# Patient Record
Sex: Male | Born: 1935 | Race: White | Hispanic: No | Marital: Married | Smoking: Never smoker
Health system: Southern US, Community
[De-identification: ages and names within clinical notes are randomized; demographics above are authoritative.]

## PROBLEM LIST (undated history)

## (undated) DIAGNOSIS — J449 Chronic obstructive pulmonary disease, unspecified: Secondary | ICD-10-CM

## (undated) DIAGNOSIS — D126 Benign neoplasm of colon, unspecified: Secondary | ICD-10-CM

## (undated) DIAGNOSIS — Z87438 Personal history of other diseases of male genital organs: Secondary | ICD-10-CM

## (undated) DIAGNOSIS — Z8619 Personal history of other infectious and parasitic diseases: Secondary | ICD-10-CM

## (undated) DIAGNOSIS — K219 Gastro-esophageal reflux disease without esophagitis: Secondary | ICD-10-CM

## (undated) DIAGNOSIS — Z862 Personal history of diseases of the blood and blood-forming organs and certain disorders involving the immune mechanism: Secondary | ICD-10-CM

## (undated) DIAGNOSIS — Z85828 Personal history of other malignant neoplasm of skin: Secondary | ICD-10-CM

## (undated) DIAGNOSIS — I341 Nonrheumatic mitral (valve) prolapse: Secondary | ICD-10-CM

## (undated) HISTORY — PX: TONSILLECTOMY: SHX5217

## (undated) HISTORY — DX: Personal history of diseases of the blood and blood-forming organs and certain disorders involving the immune mechanism: Z86.2

## (undated) HISTORY — DX: Nonrheumatic mitral (valve) prolapse: I34.1

## (undated) HISTORY — DX: Gastro-esophageal reflux disease without esophagitis: K21.9

## (undated) HISTORY — PX: APPENDECTOMY: SHX54

## (undated) HISTORY — DX: Personal history of other diseases of male genital organs: Z87.438

## (undated) HISTORY — PX: HERNIA REPAIR: SHX51

## (undated) HISTORY — DX: Chronic obstructive pulmonary disease, unspecified: J44.9

## (undated) HISTORY — DX: Personal history of other infectious and parasitic diseases: Z86.19

## (undated) HISTORY — DX: Personal history of other malignant neoplasm of skin: Z85.828

## (undated) HISTORY — DX: Benign neoplasm of colon, unspecified: D12.6

---

## 2000-04-20 ENCOUNTER — Ambulatory Visit (HOSPITAL_BASED_OUTPATIENT_CLINIC_OR_DEPARTMENT_OTHER): Admission: RE | Admit: 2000-04-20 | Discharge: 2000-04-20 | Payer: Self-pay | Admitting: *Deleted

## 2001-06-28 ENCOUNTER — Encounter: Payer: Self-pay | Admitting: Family Medicine

## 2001-06-28 ENCOUNTER — Encounter: Admission: RE | Admit: 2001-06-28 | Discharge: 2001-06-28 | Payer: Self-pay | Admitting: Family Medicine

## 2001-10-23 ENCOUNTER — Ambulatory Visit (HOSPITAL_COMMUNITY): Admission: RE | Admit: 2001-10-23 | Discharge: 2001-10-23 | Payer: Self-pay | Admitting: Gastroenterology

## 2003-07-12 ENCOUNTER — Encounter: Admission: RE | Admit: 2003-07-12 | Discharge: 2003-07-12 | Payer: Self-pay | Admitting: Family Medicine

## 2003-07-12 ENCOUNTER — Encounter: Payer: Self-pay | Admitting: Family Medicine

## 2004-01-30 ENCOUNTER — Ambulatory Visit (HOSPITAL_BASED_OUTPATIENT_CLINIC_OR_DEPARTMENT_OTHER): Admission: RE | Admit: 2004-01-30 | Discharge: 2004-01-30 | Payer: Self-pay | Admitting: Surgery

## 2004-01-30 ENCOUNTER — Ambulatory Visit (HOSPITAL_COMMUNITY): Admission: RE | Admit: 2004-01-30 | Discharge: 2004-01-30 | Payer: Self-pay | Admitting: Surgery

## 2004-08-02 ENCOUNTER — Emergency Department (HOSPITAL_COMMUNITY): Admission: EM | Admit: 2004-08-02 | Discharge: 2004-08-03 | Payer: Self-pay | Admitting: Emergency Medicine

## 2004-08-04 ENCOUNTER — Emergency Department (HOSPITAL_COMMUNITY): Admission: EM | Admit: 2004-08-04 | Discharge: 2004-08-04 | Payer: Self-pay | Admitting: Emergency Medicine

## 2004-08-07 ENCOUNTER — Emergency Department (HOSPITAL_COMMUNITY): Admission: EM | Admit: 2004-08-07 | Discharge: 2004-08-07 | Payer: Self-pay | Admitting: Emergency Medicine

## 2004-08-20 ENCOUNTER — Encounter: Admission: RE | Admit: 2004-08-20 | Discharge: 2004-08-20 | Payer: Self-pay | Admitting: Family Medicine

## 2004-08-27 ENCOUNTER — Encounter: Admission: RE | Admit: 2004-08-27 | Discharge: 2004-08-27 | Payer: Self-pay | Admitting: Gastroenterology

## 2004-09-07 ENCOUNTER — Ambulatory Visit (HOSPITAL_COMMUNITY): Admission: RE | Admit: 2004-09-07 | Discharge: 2004-09-07 | Payer: Self-pay | Admitting: Gastroenterology

## 2005-06-22 ENCOUNTER — Encounter: Admission: RE | Admit: 2005-06-22 | Discharge: 2005-06-22 | Payer: Self-pay | Admitting: Gastroenterology

## 2005-06-29 ENCOUNTER — Ambulatory Visit (HOSPITAL_COMMUNITY): Admission: RE | Admit: 2005-06-29 | Discharge: 2005-06-29 | Payer: Self-pay | Admitting: Gastroenterology

## 2006-03-23 ENCOUNTER — Encounter: Admission: RE | Admit: 2006-03-23 | Discharge: 2006-03-23 | Payer: Self-pay | Admitting: Family Medicine

## 2011-10-26 ENCOUNTER — Other Ambulatory Visit: Payer: Self-pay | Admitting: Gastroenterology

## 2012-03-21 ENCOUNTER — Other Ambulatory Visit: Payer: Self-pay | Admitting: Dermatology

## 2012-04-20 ENCOUNTER — Other Ambulatory Visit: Payer: Self-pay | Admitting: Dermatology

## 2012-07-04 ENCOUNTER — Other Ambulatory Visit (HOSPITAL_COMMUNITY): Payer: Self-pay | Admitting: Radiology

## 2012-07-04 DIAGNOSIS — R0602 Shortness of breath: Secondary | ICD-10-CM

## 2012-07-05 ENCOUNTER — Ambulatory Visit (HOSPITAL_COMMUNITY): Payer: Medicare Other | Attending: Cardiovascular Disease | Admitting: Radiology

## 2012-07-05 DIAGNOSIS — R0989 Other specified symptoms and signs involving the circulatory and respiratory systems: Secondary | ICD-10-CM | POA: Insufficient documentation

## 2012-07-05 DIAGNOSIS — I059 Rheumatic mitral valve disease, unspecified: Secondary | ICD-10-CM | POA: Insufficient documentation

## 2012-07-05 DIAGNOSIS — R079 Chest pain, unspecified: Secondary | ICD-10-CM | POA: Insufficient documentation

## 2012-07-05 DIAGNOSIS — R0609 Other forms of dyspnea: Secondary | ICD-10-CM | POA: Insufficient documentation

## 2012-07-05 DIAGNOSIS — R0602 Shortness of breath: Secondary | ICD-10-CM | POA: Insufficient documentation

## 2012-07-05 NOTE — Progress Notes (Signed)
Echocardiogram performed.  

## 2012-07-06 ENCOUNTER — Encounter (HOSPITAL_COMMUNITY): Payer: Self-pay | Admitting: Family Medicine

## 2012-08-30 ENCOUNTER — Institutional Professional Consult (permissible substitution): Payer: Medicare Other | Admitting: Internal Medicine

## 2012-11-01 ENCOUNTER — Other Ambulatory Visit: Payer: Self-pay | Admitting: Dermatology

## 2013-07-05 ENCOUNTER — Other Ambulatory Visit: Payer: Self-pay | Admitting: Dermatology

## 2013-09-03 ENCOUNTER — Other Ambulatory Visit: Payer: Self-pay | Admitting: Dermatology

## 2013-09-05 ENCOUNTER — Ambulatory Visit (INDEPENDENT_AMBULATORY_CARE_PROVIDER_SITE_OTHER): Payer: Medicare PPO | Admitting: Family Medicine

## 2013-09-05 DIAGNOSIS — Z23 Encounter for immunization: Secondary | ICD-10-CM

## 2013-11-12 ENCOUNTER — Ambulatory Visit (INDEPENDENT_AMBULATORY_CARE_PROVIDER_SITE_OTHER): Payer: Medicare PPO | Admitting: Family Medicine

## 2013-11-12 DIAGNOSIS — Z23 Encounter for immunization: Secondary | ICD-10-CM

## 2013-12-03 ENCOUNTER — Other Ambulatory Visit: Payer: Self-pay | Admitting: Family Medicine

## 2013-12-03 ENCOUNTER — Telehealth: Payer: Self-pay | Admitting: Family Medicine

## 2013-12-03 MED ORDER — AMOXICILLIN 875 MG PO TABS: 875.0000 mg | ORAL_TABLET | Freq: Two times a day (BID) | ORAL | Status: DC

## 2013-12-03 NOTE — Telephone Encounter (Signed)
Sounds like upper resp infection.  Most likely viral.  I am okay with calling out amoxicillin 875 bid for 10 days (there is no pharmacy on record) with strict instructions not to fill unless he develops sinus pain or fever or symptoms last more than 1 week.

## 2013-12-03 NOTE — Telephone Encounter (Signed)
Patient aware and med sent to pharm 

## 2013-12-03 NOTE — Telephone Encounter (Signed)
He says he has a sinus infection and wants something called in.  No fever , coughing up some yellowish stuff Please call

## 2014-06-04 ENCOUNTER — Other Ambulatory Visit: Payer: Medicare PPO

## 2014-06-04 ENCOUNTER — Encounter: Payer: Self-pay | Admitting: Family Medicine

## 2014-06-04 DIAGNOSIS — Z79899 Other long term (current) drug therapy: Secondary | ICD-10-CM

## 2014-06-04 DIAGNOSIS — Z862 Personal history of diseases of the blood and blood-forming organs and certain disorders involving the immune mechanism: Secondary | ICD-10-CM | POA: Insufficient documentation

## 2014-06-04 DIAGNOSIS — Z87438 Personal history of other diseases of male genital organs: Secondary | ICD-10-CM | POA: Insufficient documentation

## 2014-06-04 DIAGNOSIS — K219 Gastro-esophageal reflux disease without esophagitis: Secondary | ICD-10-CM | POA: Insufficient documentation

## 2014-06-04 DIAGNOSIS — N400 Enlarged prostate without lower urinary tract symptoms: Secondary | ICD-10-CM

## 2014-06-04 DIAGNOSIS — Z0000 Encounter for general adult medical examination without abnormal findings: Secondary | ICD-10-CM

## 2014-06-04 DIAGNOSIS — Z8619 Personal history of other infectious and parasitic diseases: Secondary | ICD-10-CM | POA: Insufficient documentation

## 2014-06-04 DIAGNOSIS — D126 Benign neoplasm of colon, unspecified: Secondary | ICD-10-CM | POA: Insufficient documentation

## 2014-06-04 DIAGNOSIS — J449 Chronic obstructive pulmonary disease, unspecified: Secondary | ICD-10-CM | POA: Insufficient documentation

## 2014-06-04 DIAGNOSIS — I341 Nonrheumatic mitral (valve) prolapse: Secondary | ICD-10-CM | POA: Insufficient documentation

## 2014-06-04 DIAGNOSIS — D869 Sarcoidosis, unspecified: Secondary | ICD-10-CM

## 2014-06-04 DIAGNOSIS — Z85828 Personal history of other malignant neoplasm of skin: Secondary | ICD-10-CM | POA: Insufficient documentation

## 2014-06-04 LAB — COMPLETE METABOLIC PANEL WITH GFR
ALT: 14 U/L (ref 0–53)
AST: 20 U/L (ref 0–37)
Albumin: 4.3 g/dL (ref 3.5–5.2)
Alkaline Phosphatase: 56 U/L (ref 39–117)
BUN: 17 mg/dL (ref 6–23)
CALCIUM: 9.4 mg/dL (ref 8.4–10.5)
CO2: 30 mEq/L (ref 19–32)
CREATININE: 0.85 mg/dL (ref 0.50–1.35)
Chloride: 103 mEq/L (ref 96–112)
GFR, EST NON AFRICAN AMERICAN: 83 mL/min
GFR, Est African American: >89 mL/min
GLUCOSE: 94 mg/dL (ref 70–99)
POTASSIUM: 4.8 meq/L (ref 3.5–5.3)
Sodium: 139 mEq/L (ref 135–145)
TOTAL PROTEIN: 6.6 g/dL (ref 6.0–8.3)
Total Bilirubin: 1 mg/dL (ref 0.2–1.2)

## 2014-06-04 LAB — LIPID PANEL
CHOL/HDL RATIO: 2.6 ratio
CHOLESTEROL: 161 mg/dL (ref 0–200)
HDL: 63 mg/dL (ref >39)
LDL Cholesterol: 87 mg/dL (ref 0–99)
TRIGLYCERIDES: 57 mg/dL (ref <150)
VLDL: 11 mg/dL (ref 0–40)

## 2014-06-04 LAB — CBC WITH DIFFERENTIAL/PLATELET
BASOS ABS: 0 10*3/uL (ref 0.0–0.1)
BASOS PCT: 0 % (ref 0–1)
Eosinophils Absolute: 0.2 10*3/uL (ref 0.0–0.7)
Eosinophils Relative: 4 % (ref 0–5)
HEMATOCRIT: 43.3 % (ref 39.0–52.0)
Hemoglobin: 14.7 g/dL (ref 13.0–17.0)
LYMPHS PCT: 26 % (ref 12–46)
Lymphs Abs: 1.3 10*3/uL (ref 0.7–4.0)
MCH: 31.3 pg (ref 26.0–34.0)
MCHC: 33.9 g/dL (ref 30.0–36.0)
MCV: 92.3 fL (ref 78.0–100.0)
MONOS PCT: 13 % — AB (ref 3–12)
Monocytes Absolute: 0.7 10*3/uL (ref 0.1–1.0)
NEUTROS PCT: 57 % (ref 43–77)
Neutro Abs: 2.9 10*3/uL (ref 1.7–7.7)
Platelets: 156 10*3/uL (ref 150–400)
RBC: 4.69 MIL/uL (ref 4.22–5.81)
RDW: 14.3 % (ref 11.5–15.5)
WBC: 5 10*3/uL (ref 4.0–10.5)

## 2014-06-04 LAB — TSH: TSH: 1.931 u[IU]/mL (ref 0.350–4.500)

## 2014-06-05 LAB — PSA, MEDICARE: PSA: 2.39 ng/mL (ref <=4.00)

## 2014-06-10 ENCOUNTER — Ambulatory Visit (INDEPENDENT_AMBULATORY_CARE_PROVIDER_SITE_OTHER): Payer: Medicare PPO | Admitting: Family Medicine

## 2014-06-10 ENCOUNTER — Encounter: Payer: Self-pay | Admitting: Family Medicine

## 2014-06-10 VITALS — BP 100/60 | HR 86 | Temp 97.9°F | Resp 16 | Ht 76.0 in | Wt 149.0 lb

## 2014-06-10 DIAGNOSIS — M79609 Pain in unspecified limb: Secondary | ICD-10-CM

## 2014-06-10 DIAGNOSIS — Z0000 Encounter for general adult medical examination without abnormal findings: Secondary | ICD-10-CM

## 2014-06-10 DIAGNOSIS — M79642 Pain in left hand: Secondary | ICD-10-CM

## 2014-06-10 NOTE — Progress Notes (Signed)
Subjective:    Patient ID: Bradley Beasley, male    DOB: 13-Sep-1933, 80 y.o.   MRN: [id number]  HPI Subjective:   Patient presents for Medicare Annual/Subsequent preventive examination. Patient injured his left hand in February leading plan. He continues to have pain along the body of the fifth metacarpal in the left hand. He also complains of some numbness and tingling in the toes on both feet.  Review Past Medical/Family/Social: Past Medical History  Diagnosis Date  . H/O sarcoidosis   . History of BPH   . History of Helicobacter pylori infection   . GERD (gastroesophageal reflux disease)     w/ hiatal hernia  . COPD (chronic obstructive pulmonary disease)   . MVP (mitral valve prolapse)     MV regerg  . Tubular adenoma of colon   . Personal history of other malignant neoplasm of skin    Past Surgical History  Procedure Laterality Date  . Tonsillectomy    . Hernia repair    . Appendectomy     No current outpatient prescriptions on file prior to visit.   No current facility-administered medications on file prior to visit.   Allergies  Allergen Reactions  . Celebrex [Celecoxib] Nausea And Vomiting  . Codeine   . Prevacid [Lansoprazole]    History   Social History  . Marital Status: Married    Spouse Name: N/A    Number of Children: N/A  . Years of Education: N/A   Occupational History  . Not on file.   Social History Main Topics  . Smoking status: Never Smoker   . Smokeless tobacco: Never Used  . Alcohol Use: No  . Drug Use: No  . Sexual Activity: Not Currently   Other Topics Concern  . Not on file   Social History Narrative  . No narrative on file   Family History  Problem Relation Age of Onset  . Cancer Mother     ovarian  . Cancer Father     pancreatic     Risk Factors  Current exercise habits:  Dietary issues discussed:   Cardiac risk factors: Obesity (BMI >= 30 kg/m2).   Depression Screen  (Note: if answer to either of the following  is "Yes", a more complete depression screening is indicated)  Over the past two weeks, have you felt down, depressed or hopeless? No Over the past two weeks, have you felt little interest or pleasure in doing things? No Have you lost interest or pleasure in daily life? No Do you often feel hopeless? No Do you cry easily over simple problems? No   Activities of Daily Living  In your present state of health, do you have any difficulty performing the following activities?:  Driving? No  Managing money? No  Feeding yourself? No  Getting from bed to chair? No  Climbing a flight of stairs? No  Preparing food and eating?: No  Bathing or showering? No  Getting dressed: No  Getting to the toilet? No  Using the toilet:No  Moving around from place to place: No  In the past year have you fallen or had a near fall?:No  Are you sexually active? No  Do you have more than one partner? No   Hearing Difficulties: No  Do you often ask people to speak up or repeat themselves? No  Do you experience ringing or noises in your ears? No Do you have difficulty understanding soft or whispered voices? No  Do you feel that you  have a problem with memory? No Do you often misplace items? No  Do you feel safe at home? Yes  Cognitive Testing  Alert? Yes Normal Appearance?Yes  Oriented to person? Yes Place? Yes  Time? Yes  Recall of three objects? Yes  Can perform simple calculations? Yes  Displays appropriate judgment?Yes  Can read the correct time from a watch face?Yes   Screening Tests / Date Colonoscopy   2012 (due this year due to polyps)                  Zostavax 2008 Pneumovax 2007 Prevnar 2014 Influenza Vaccine UTD Tetanus/tdap 2012    Review of Systems  All other systems reviewed and are negative.      Objective:   Physical Exam  Vitals reviewed. Constitutional: He is oriented to person, place, and time. He appears well-developed and well-nourished. No distress.  HENT:  Head:  Normocephalic and atraumatic.  Right Ear: External ear normal.  Left Ear: External ear normal.  Nose: Nose normal.  Mouth/Throat: Oropharynx is clear and moist. No oropharyngeal exudate.  Eyes: Conjunctivae and EOM are normal. Pupils are equal, round, and reactive to light. Right eye exhibits no discharge. No scleral icterus.  Neck: Normal range of motion. Neck supple. No JVD present. No tracheal deviation present. No thyromegaly present.  Cardiovascular: Normal rate, regular rhythm, normal heart sounds and intact distal pulses.  Exam reveals no gallop and no friction rub.   No murmur heard. Pulmonary/Chest: Effort normal and breath sounds normal. No stridor. No respiratory distress. He has no wheezes. He has no rales. He exhibits no tenderness.  Abdominal: Soft. Bowel sounds are normal. He exhibits no distension and no mass. There is no tenderness. There is no rebound and no guarding.  Musculoskeletal: Normal range of motion. He exhibits no edema and no tenderness.  Lymphadenopathy:    He has no cervical adenopathy.  Neurological: He is alert and oriented to person, place, and time. He has normal reflexes. He displays normal reflexes. No cranial nerve deficit. He exhibits normal muscle tone. Coordination normal.  Skin: Skin is warm. No rash noted. He is not diaphoretic. No erythema. No pallor.  Psychiatric: He has a normal mood and affect. His behavior is normal. Judgment and thought content normal.   patient has a suspicious mole in the center of his lower back. I have recommended dermatology consultation for biopsy.        Assessment & Plan:  1. Hand pain, left Obtain x-ray of the left hand to rule out stress fracture and nonunion - DG Hand Complete Left; Future  2. Routine general medical examination at a health care facility Physical exam is completely normal. His most recent labs are listed below: Lab on 06/04/2014  Component Date Value Ref Range Status  . PSA 06/04/2014 2.39   <=4.00 ng/mL Final   Comment: Test Methodology: ECLIA PSA (Electrochemiluminescence Immunoassay)                                                     For PSA values from 2.5-4.0, particularly in younger men <60 years                          old, the AUA and NCCN suggest testing for % Free PSA (3515) and  evaluation of the rate of increase in PSA (PSA velocity).  . Cholesterol 06/04/2014 161  0 - 200 mg/dL Final   Comment: ATP III Classification:                                < 200        mg/dL        Desirable                               200 - 239     mg/dL        Borderline High                               >= 240        mg/dL        High                             . Triglycerides 06/04/2014 57  <150 mg/dL Final  . HDL 06/04/2014 63  >39 mg/dL Final  . Total CHOL/HDL Ratio 06/04/2014 2.6   Final  . VLDL 06/04/2014 11  0 - 40 mg/dL Final  . LDL Cholesterol 06/04/2014 87  0 - 99 mg/dL Final   Comment:                            Total Cholesterol/HDL Ratio:CHD Risk                                                 Coronary Heart Disease Risk Table                                                                 Men       Women                                   1/2 Average Risk              3.4        3.3                                       Average Risk              5.0        4.4                                    2X Average Risk              9.6        7.1  3X Average Risk             23.4       11.0                          Use the calculated Patient Ratio above and the CHD Risk table                           to determine the patient's CHD Risk.                          ATP III Classification (LDL):                                < 100        mg/dL         Optimal                               100 - 129     mg/dL         Near or Above Optimal                               130 - 159     mg/dL         Borderline High                                160 - 189     mg/dL         High                                > 190        mg/dL         Very High                             . TSH 06/04/2014 1.931  0.350 - 4.500 uIU/mL Final  . WBC 06/04/2014 5.0  4.0 - 10.5 K/uL Final  . RBC 06/04/2014 4.69  4.22 - 5.81 MIL/uL Final  . Hemoglobin 06/04/2014 14.7  13.0 - 17.0 g/dL Final  . HCT 06/04/2014 43.3  39.0 - 52.0 % Final  . MCV 06/04/2014 92.3  78.0 - 100.0 fL Final  . MCH 06/04/2014 31.3  26.0 - 34.0 pg Final  . MCHC 06/04/2014 33.9  30.0 - 36.0 g/dL Final  . RDW 06/04/2014 14.3  11.5 - 15.5 % Final  . Platelets 06/04/2014 156  150 - 400 K/uL Final  . Neutrophils Relative % 06/04/2014 57  43 - 77 % Final  . Neutro Abs 06/04/2014 2.9  1.7 - 7.7 K/uL Final  . Lymphocytes Relative 06/04/2014 26  12 - 46 % Final  . Lymphs Abs 06/04/2014 1.3  0.7 - 4.0 K/uL Final  . Monocytes Relative 06/04/2014 13* 3 - 12 % Final  . Monocytes Absolute 06/04/2014 0.7  0.1 - 1.0 K/uL Final  . Eosinophils Relative 06/04/2014 4  0 - 5 % Final  . Eosinophils Absolute 06/04/2014 0.2  0.0 - 0.7 K/uL Final  .  Basophils Relative 06/04/2014 0  0 - 1 % Final  . Basophils Absolute 06/04/2014 0.0  0.0 - 0.1 K/uL Final  . Smear Review 06/04/2014 Criteria for review not met   Final  . Sodium 06/04/2014 139  135 - 145 mEq/L Final  . Potassium 06/04/2014 4.8  3.5 - 5.3 mEq/L Final  . Chloride 06/04/2014 103  96 - 112 mEq/L Final  . CO2 06/04/2014 30  19 - 32 mEq/L Final  . Glucose, Bld 06/04/2014 94  70 - 99 mg/dL Final  . BUN 06/04/2014 17  6 - 23 mg/dL Final  . Creat 06/04/2014 0.85  0.50 - 1.35 mg/dL Final  . Total Bilirubin 06/04/2014 1.0  0.2 - 1.2 mg/dL Final  . Alkaline Phosphatase 06/04/2014 56  39 - 117 U/L Final  . AST 06/04/2014 20  0 - 37 U/L Final  . ALT 06/04/2014 14  0 - 53 U/L Final  . Total Protein 06/04/2014 6.6  6.0 - 8.3 g/dL Final  . Albumin 06/04/2014 4.3  3.5 - 5.2 g/dL Final  . Calcium 06/04/2014 9.4  8.4 - 10.5 mg/dL Final   . GFR, Est African American 06/04/2014 >89   Final  . GFR, Est Non African American 06/04/2014 83   Final   Comment:                            The estimated GFR is a calculation valid for adults (>=84 years old)                          that uses the CKD-EPI algorithm to adjust for age and sex. It is                            not to be used for children, pregnant women, hospitalized patients,                             patients on dialysis, or with rapidly changing kidney function.                          According to the NKDEP, eGFR >89 is normal, 60-89 shows mild                          impairment, 30-59 shows moderate impairment, 15-29 shows severe                          impairment and <15 is ESRD.                              Labs are excellent. His preventative care is up-to-date.  Screen is negative for depression. Medicare Attestation  I have personally reviewed:  The patient's medical and social history  Their use of alcohol, tobacco or illicit drugs  Their current medications and supplements  The patient's functional ability including ADLs,fall risks, home safety risks, cognitive, and hearing and visual impairment  Diet and physical activities  Evidence for depression or mood disorders  The patient's weight, height, BMI, and visual acuity have been recorded in the chart. I have made referrals, counseling, and provided education to the patient based on review  of the above and I have provided the patient with a written personalized care plan for preventive services.

## 2014-06-19 ENCOUNTER — Telehealth: Payer: Self-pay | Admitting: *Deleted

## 2014-06-19 ENCOUNTER — Ambulatory Visit
Admission: RE | Admit: 2014-06-19 | Discharge: 2014-06-19 | Disposition: A | Discharge status: Home / Self Care | Payer: Medicare PPO | Source: Ambulatory Visit | Attending: Family Medicine | Admitting: Family Medicine

## 2014-06-19 ENCOUNTER — Other Ambulatory Visit: Payer: Self-pay | Admitting: Family Medicine

## 2014-06-19 DIAGNOSIS — M79641 Pain in right hand: Secondary | ICD-10-CM

## 2014-06-19 NOTE — Telephone Encounter (Signed)
Received call from [facility] with [facility].   Reports that patient is present for x-ray. Reports that patient reports R hand should have x-ray, but L hand is ordered.   Advised to use patient description for pain and x-ray R hand. Order placed.

## 2014-07-08 ENCOUNTER — Other Ambulatory Visit: Payer: Self-pay | Admitting: Dermatology

## 2014-08-27 ENCOUNTER — Encounter: Payer: Self-pay | Admitting: Family Medicine

## 2014-08-27 ENCOUNTER — Ambulatory Visit (INDEPENDENT_AMBULATORY_CARE_PROVIDER_SITE_OTHER): Payer: Medicare PPO | Admitting: Family Medicine

## 2014-08-27 ENCOUNTER — Ambulatory Visit
Admission: RE | Admit: 2014-08-27 | Discharge: 2014-08-27 | Disposition: A | Discharge status: Home / Self Care | Payer: Medicare PPO | Source: Ambulatory Visit | Attending: Family Medicine | Admitting: Family Medicine

## 2014-08-27 VITALS — BP 132/80 | HR 68 | Temp 98.0°F | Resp 18 | Wt 149.0 lb

## 2014-08-27 DIAGNOSIS — R0789 Other chest pain: Secondary | ICD-10-CM

## 2014-08-27 DIAGNOSIS — R109 Unspecified abdominal pain: Secondary | ICD-10-CM

## 2014-08-27 NOTE — Progress Notes (Signed)
Subjective:    Patient ID: Bradley Beasley, male    DOB: 1933/11/01, 80 y.o.   MRN: [id number]  HPI Patient states that for the last week, he has been extremely gassy and bloated. He reports indigestion. He also reports frequent flatus. He does report constipation and irregular bowel movements. However his bigger concern is an intermittent chest pressure that is located in his upper left chest. He denies any true angina. The pressure occurs daily and lasts hours.. He denies any cough or hemoptysis. He denies any fever. He denies any pleurisy. There are no exacerbating or alleviating factors. He denies any melena or hematochezia.  Pain and pressure is actually improved with movement. Pressure also improves with activity. EKG reveals normal sinus rhythm at 60 beats per minute with a first degree AV block. Patient also has a right axis deviation.  Past Medical History  Diagnosis Date  . H/O sarcoidosis   . History of BPH   . History of Helicobacter pylori infection   . GERD (gastroesophageal reflux disease)     w/ hiatal hernia  . COPD (chronic obstructive pulmonary disease)   . MVP (mitral valve prolapse)     MV regerg  . Tubular adenoma of colon   . Personal history of other malignant neoplasm of skin    Past Surgical History  Procedure Laterality Date  . Tonsillectomy    . Hernia repair    . Appendectomy     Current Outpatient Prescriptions on File Prior to Visit  Medication Sig Dispense Refill  . aspirin 81 MG tablet Take 81 mg by mouth daily.       No current facility-administered medications on file prior to visit.   Allergies  Allergen Reactions  . Celebrex [Celecoxib] Nausea And Vomiting  . Codeine   . Prevacid [Lansoprazole]    History   Social History  . Marital Status: Married    Spouse Name: N/A    Number of Children: N/A  . Years of Education: N/A   Occupational History  . Not on file.   Social History Main Topics  . Smoking status: Never Smoker   .  Smokeless tobacco: Never Used  . Alcohol Use: No  . Drug Use: No  . Sexual Activity: Not Currently   Other Topics Concern  . Not on file   Social History Narrative  . No narrative on file      Review of Systems  All other systems reviewed and are negative.      Objective:   Physical Exam  Vitals reviewed. Constitutional: He appears well-developed and well-nourished. No distress.  Cardiovascular: Normal rate, regular rhythm and normal heart sounds.   No murmur heard. Pulmonary/Chest: Effort normal and breath sounds normal. No respiratory distress. He has no wheezes. He has no rales.  Abdominal: Soft. Bowel sounds are normal. He exhibits no distension. There is no tenderness. There is no rebound and no guarding.  Musculoskeletal: He exhibits no edema.  Skin: He is not diaphoretic.          Assessment & Plan:  Chest tightness or pressure - Plan: DG Abd Acute W/Chest  Abdominal pain, other specified site - Plan: DG Abd Acute W/Chest  I believe the patient is having pain and pressure in his chest due to hiatal hernia coupled with gastroesophageal reflux disease. I believe the lower abdominal bloating is also likely due to dyspepsia. Obtain an x-ray of the abdomen and pelvis to rule out a bowel obstruction as well as  from there. I also obtained a chest x-ray to complete the workup for chest pain. His x-rays are normal have recommended the patient begin taking Dexilant 60 mg poqday and recheck in one week. He is to go to the hospital immediately if symptoms worsen.

## 2014-08-30 ENCOUNTER — Telehealth: Payer: Self-pay | Admitting: Family Medicine

## 2014-08-30 NOTE — Telephone Encounter (Signed)
Patients wife shelby calling to let us know that after taking a certain medication we prescribed he had a certain episode that happened and would like to speak with the nurse or dr pickard about this please call her back at [phone]

## 2014-08-30 NOTE — Telephone Encounter (Signed)
Called patient.  He feels not medication.  States Wednesday evening had episode of severe abd pain and nausea.  Almost passed out.  Short time later had large BM and then felt better.  Does not feel like Dexilant is helping.  States feels he is having trouble with BM's.  Can go several days w/o BM then has abd pain.  Has Colonoscopy sched in November at [facility].  Going to call GI Monday to see about moving up.  If you want him to continue [place] let him know

## 2014-09-02 MED ORDER — LINACLOTIDE 145 MCG PO CAPS: 145.0000 ug | ORAL_CAPSULE | Freq: Every day | ORAL | Status: DC

## 2014-09-02 NOTE — Telephone Encounter (Signed)
Definitely try to move up gi visit.  Please add linzess 145 mcg poqday and see if symptoms improve.

## 2014-09-02 NOTE — Telephone Encounter (Signed)
Spoke to patient.  Made aware of Rx for Linzess.  Instructed how to take.  He is going to call GI and see about moving appt up.

## 2014-09-03 ENCOUNTER — Ambulatory Visit (INDEPENDENT_AMBULATORY_CARE_PROVIDER_SITE_OTHER): Payer: Medicare PPO | Admitting: *Deleted

## 2014-09-03 DIAGNOSIS — Z23 Encounter for immunization: Secondary | ICD-10-CM

## 2014-09-10 ENCOUNTER — Encounter: Payer: Self-pay | Admitting: Family Medicine

## 2014-09-27 ENCOUNTER — Other Ambulatory Visit: Payer: Self-pay | Admitting: Gastroenterology

## 2014-10-28 ENCOUNTER — Encounter: Payer: Self-pay | Admitting: Family Medicine

## 2015-01-13 ENCOUNTER — Other Ambulatory Visit: Payer: Self-pay | Admitting: Dermatology

## 2015-07-14 DIAGNOSIS — R140 Abdominal distension (gaseous): Secondary | ICD-10-CM | POA: Diagnosis not present

## 2015-07-14 DIAGNOSIS — M791 Myalgia: Secondary | ICD-10-CM | POA: Diagnosis not present

## 2015-08-18 DIAGNOSIS — L570 Actinic keratosis: Secondary | ICD-10-CM | POA: Diagnosis not present

## 2015-08-18 DIAGNOSIS — L821 Other seborrheic keratosis: Secondary | ICD-10-CM | POA: Diagnosis not present

## 2015-08-18 DIAGNOSIS — C44519 Basal cell carcinoma of skin of other part of trunk: Secondary | ICD-10-CM | POA: Diagnosis not present

## 2015-08-18 DIAGNOSIS — D485 Neoplasm of uncertain behavior of skin: Secondary | ICD-10-CM | POA: Diagnosis not present

## 2015-08-18 DIAGNOSIS — Z85828 Personal history of other malignant neoplasm of skin: Secondary | ICD-10-CM | POA: Diagnosis not present

## 2015-09-08 DIAGNOSIS — H25013 Cortical age-related cataract, bilateral: Secondary | ICD-10-CM | POA: Diagnosis not present

## 2015-09-08 DIAGNOSIS — H5203 Hypermetropia, bilateral: Secondary | ICD-10-CM | POA: Diagnosis not present

## 2015-10-06 ENCOUNTER — Other Ambulatory Visit: Payer: Medicare PPO

## 2015-10-06 ENCOUNTER — Other Ambulatory Visit: Payer: Self-pay | Admitting: Family Medicine

## 2015-10-06 DIAGNOSIS — N400 Enlarged prostate without lower urinary tract symptoms: Secondary | ICD-10-CM

## 2015-10-06 DIAGNOSIS — D869 Sarcoidosis, unspecified: Secondary | ICD-10-CM

## 2015-10-06 DIAGNOSIS — Z79899 Other long term (current) drug therapy: Secondary | ICD-10-CM

## 2015-10-06 DIAGNOSIS — K219 Gastro-esophageal reflux disease without esophagitis: Secondary | ICD-10-CM

## 2015-10-06 DIAGNOSIS — Z0000 Encounter for general adult medical examination without abnormal findings: Secondary | ICD-10-CM | POA: Diagnosis not present

## 2015-10-06 LAB — LIPID PANEL
CHOL/HDL RATIO: 2.7 ratio (ref <=5.0)
CHOLESTEROL: 168 mg/dL (ref 125–200)
HDL: 63 mg/dL (ref >=40)
LDL CALC: 90 mg/dL (ref <130)
Triglycerides: 74 mg/dL (ref <150)
VLDL: 15 mg/dL (ref <30)

## 2015-10-06 LAB — CBC WITH DIFFERENTIAL/PLATELET
BASOS PCT: 1 % (ref 0–1)
Basophils Absolute: 0 10*3/uL (ref 0.0–0.1)
EOS ABS: 0.2 10*3/uL (ref 0.0–0.7)
EOS PCT: 5 % (ref 0–5)
HCT: 46 % (ref 39.0–52.0)
Hemoglobin: 15.2 g/dL (ref 13.0–17.0)
LYMPHS ABS: 1.1 10*3/uL (ref 0.7–4.0)
Lymphocytes Relative: 30 % (ref 12–46)
MCH: 31.7 pg (ref 26.0–34.0)
MCHC: 33 g/dL (ref 30.0–36.0)
MCV: 96 fL (ref 78.0–100.0)
MONOS PCT: 12 % (ref 3–12)
MPV: 10.5 fL (ref 8.6–12.4)
Monocytes Absolute: 0.5 10*3/uL (ref 0.1–1.0)
Neutro Abs: 2 10*3/uL (ref 1.7–7.7)
Neutrophils Relative %: 52 % (ref 43–77)
PLATELETS: 164 10*3/uL (ref 150–400)
RBC: 4.79 MIL/uL (ref 4.22–5.81)
RDW: 13.8 % (ref 11.5–15.5)
WBC: 3.8 10*3/uL — ABNORMAL LOW (ref 4.0–10.5)

## 2015-10-06 LAB — COMPLETE METABOLIC PANEL WITH GFR
ALBUMIN: 4.3 g/dL (ref 3.6–5.1)
ALT: 15 U/L (ref 9–46)
AST: 21 U/L (ref 10–35)
Alkaline Phosphatase: 66 U/L (ref 40–115)
BUN: 17 mg/dL (ref 7–25)
CALCIUM: 9.5 mg/dL (ref 8.6–10.3)
CHLORIDE: 100 mmol/L (ref 98–110)
CO2: 32 mmol/L — AB (ref 20–31)
Creat: 0.86 mg/dL (ref 0.70–1.18)
GFR, EST NON AFRICAN AMERICAN: 82 mL/min (ref >=60)
GLUCOSE: 98 mg/dL (ref 70–99)
Potassium: 4.5 mmol/L (ref 3.5–5.3)
Sodium: 139 mmol/L (ref 135–146)
TOTAL PROTEIN: 6.7 g/dL (ref 6.1–8.1)
Total Bilirubin: 1 mg/dL (ref 0.2–1.2)

## 2015-10-06 LAB — TSH: TSH: 2.11 u[IU]/mL (ref 0.350–4.500)

## 2015-10-07 ENCOUNTER — Encounter: Payer: Self-pay | Admitting: Family Medicine

## 2015-10-07 ENCOUNTER — Ambulatory Visit (INDEPENDENT_AMBULATORY_CARE_PROVIDER_SITE_OTHER): Payer: Medicare PPO | Admitting: Family Medicine

## 2015-10-07 VITALS — BP 98/62 | HR 80 | Temp 98.6°F | Resp 16 | Ht 75.0 in | Wt 146.0 lb

## 2015-10-07 DIAGNOSIS — Z0000 Encounter for general adult medical examination without abnormal findings: Secondary | ICD-10-CM | POA: Diagnosis not present

## 2015-10-07 LAB — PSA, MEDICARE: PSA: 0.55 ng/mL (ref <=4.00)

## 2015-10-07 NOTE — Progress Notes (Signed)
Subjective:    Patient ID: Bradley Beasley, male    DOB: [birth year], 81 y.o.   MRN: [id number]  HPI Wt Readings from Last 3 Encounters:  10/07/15 146 lb (66.225 kg)  08/27/14 149 lb (67.586 kg)  06/10/14 149 lb (67.586 kg)   patient is a very pleasant 81 year old white male who is here today for complete physical exam. Colonoscopy is up-to-date. Patient's Pneumovax 23, Prevnar 13, shingles vaccine are up-to-date. Patient is due for a flu shot. His only medical concern is numbness and tingling in all 5 toes on both feet consistent with peripheral neuropathy. His blood sugar and thyroid tests were normal. I've never checked a vitamin B12 level. Otherwise he is doing well with no concerns Past Medical History  Diagnosis Date  . H/O sarcoidosis   . History of BPH   . History of Helicobacter pylori infection   . GERD (gastroesophageal reflux disease)     w/ hiatal hernia  . COPD (chronic obstructive pulmonary disease) ([place])   . MVP (mitral valve prolapse)     MV regerg  . Tubular adenoma of colon   . Personal history of other malignant neoplasm of skin    Past Surgical History  Procedure Laterality Date  . Tonsillectomy    . Hernia repair    . Appendectomy     Current Outpatient Prescriptions on File Prior to Visit  Medication Sig Dispense Refill  . aspirin 81 MG tablet Take 81 mg by mouth daily.     No current facility-administered medications on file prior to visit.   Allergies  Allergen Reactions  . Celebrex [Celecoxib] Nausea And Vomiting  . Codeine   . Prevacid [Lansoprazole]    Social History   Social History  . Marital Status: Married    Spouse Name: N/A  . Number of Children: N/A  . Years of Education: N/A   Occupational History  . Not on file.   Social History Main Topics  . Smoking status: Never Smoker   . Smokeless tobacco: Never Used  . Alcohol Use: No  . Drug Use: No  . Sexual Activity: Not Currently   Other Topics Concern  . Not on file   Social  History Narrative   Family History  Problem Relation Age of Onset  . Cancer Mother     ovarian  . Cancer Father     pancreatic       Review of Systems  All other systems reviewed and are negative.      Objective:   Physical Exam  Constitutional: He is oriented to person, place, and time. He appears well-developed and well-nourished. No distress.  HENT:  Head: Normocephalic and atraumatic.  Right Ear: External ear normal.  Left Ear: External ear normal.  Nose: Nose normal.  Mouth/Throat: Oropharynx is clear and moist. No oropharyngeal exudate.  Eyes: Conjunctivae and EOM are normal. Pupils are equal, round, and reactive to light. Right eye exhibits no discharge. Left eye exhibits no discharge. No scleral icterus.  Neck: Normal range of motion. Neck supple. No JVD present. No tracheal deviation present. No thyromegaly present.  Cardiovascular: Normal rate, regular rhythm, normal heart sounds and intact distal pulses.  Exam reveals no gallop and no friction rub.   No murmur heard. Pulmonary/Chest: Effort normal and breath sounds normal. No stridor. No respiratory distress. He has no wheezes. He has no rales. He exhibits no tenderness.  Abdominal: Soft. Bowel sounds are normal. He exhibits no distension and no mass. There is  no tenderness. There is no rebound and no guarding.  Musculoskeletal: Normal range of motion. He exhibits no edema or tenderness.  Lymphadenopathy:    He has no cervical adenopathy.  Neurological: He is alert and oriented to person, place, and time. He has normal reflexes. He displays normal reflexes. No cranial nerve deficit. He exhibits normal muscle tone. Coordination normal.  Skin: Skin is warm. No rash noted. He is not diaphoretic. No erythema. No pallor.  Psychiatric: He has a normal mood and affect. His behavior is normal. Judgment and thought content normal.  Vitals reviewed.         Assessment & Plan:  Routine general medical examination at a  health care facility  Patient's physical exam is completely normal. I have include his most recent lab work below: Lab on 10/06/2015  Component Date Value Ref Range Status  . Sodium 10/06/2015 139  135 - 146 mmol/L Final  . Potassium 10/06/2015 4.5  3.5 - 5.3 mmol/L Final  . Chloride 10/06/2015 100  98 - 110 mmol/L Final  . CO2 10/06/2015 32* 20 - 31 mmol/L Final  . Glucose, Bld 10/06/2015 98  70 - 99 mg/dL Final  . BUN 10/06/2015 17  7 - 25 mg/dL Final  . Creat 10/06/2015 0.86  0.70 - 1.18 mg/dL Final  . Total Bilirubin 10/06/2015 1.0  0.2 - 1.2 mg/dL Final  . Alkaline Phosphatase 10/06/2015 66  40 - 115 U/L Final  . AST 10/06/2015 21  10 - 35 U/L Final  . ALT 10/06/2015 15  9 - 46 U/L Final  . Total Protein 10/06/2015 6.7  6.1 - 8.1 g/dL Final  . Albumin 10/06/2015 4.3  3.6 - 5.1 g/dL Final  . Calcium 10/06/2015 9.5  8.6 - 10.3 mg/dL Final  . GFR, Est African American 10/06/2015 >89  >=60 mL/min Final  . GFR, Est Non African American 10/06/2015 82  >=60 mL/min Final   Comment:   The estimated GFR is a calculation valid for adults (>=6 years old) that uses the CKD-EPI algorithm to adjust for age and sex. It is   not to be used for children, pregnant women, hospitalized patients,    patients on dialysis, or with rapidly changing kidney function. According to the NKDEP, eGFR >89 is normal, 60-89 shows mild impairment, 30-59 shows moderate impairment, 15-29 shows severe impairment and <15 is ESRD.     Marland Kitchen TSH 10/06/2015 2.110  0.350 - 4.500 uIU/mL Final  . Cholesterol 10/06/2015 168  125 - 200 mg/dL Final  . Triglycerides 10/06/2015 74  <150 mg/dL Final  . HDL 10/06/2015 63  >=40 mg/dL Final  . Total CHOL/HDL Ratio 10/06/2015 2.7  <=5.0 Ratio Final  . VLDL 10/06/2015 15  <30 mg/dL Final  . LDL Cholesterol 10/06/2015 90  <130 mg/dL Final   Comment:   Total Cholesterol/HDL Ratio:CHD Risk                        Coronary Heart Disease Risk Table                                         Men       Women          1/2 Average Risk              3.4        3.3  Average Risk              5.0        4.4           2X Average Risk              9.6        7.1           3X Average Risk             23.4       11.0 Use the calculated Patient Ratio above and the CHD Risk table  to determine the patient's CHD Risk.   . WBC 10/06/2015 3.8* 4.0 - 10.5 K/uL Final  . RBC 10/06/2015 4.79  4.22 - 5.81 MIL/uL Final  . Hemoglobin 10/06/2015 15.2  13.0 - 17.0 g/dL Final  . HCT 10/06/2015 46.0  39.0 - 52.0 % Final  . MCV 10/06/2015 96.0  78.0 - 100.0 fL Final  . MCH 10/06/2015 31.7  26.0 - 34.0 pg Final  . MCHC 10/06/2015 33.0  30.0 - 36.0 g/dL Final  . RDW 10/06/2015 13.8  11.5 - 15.5 % Final  . Platelets 10/06/2015 164  150 - 400 K/uL Final  . MPV 10/06/2015 10.5  8.6 - 12.4 fL Final  . Neutrophils Relative % 10/06/2015 52  43 - 77 % Final  . Neutro Abs 10/06/2015 2.0  1.7 - 7.7 K/uL Final  . Lymphocytes Relative 10/06/2015 30  12 - 46 % Final  . Lymphs Abs 10/06/2015 1.1  0.7 - 4.0 K/uL Final  . Monocytes Relative 10/06/2015 12  3 - 12 % Final  . Monocytes Absolute 10/06/2015 0.5  0.1 - 1.0 K/uL Final  . Eosinophils Relative 10/06/2015 5  0 - 5 % Final  . Eosinophils Absolute 10/06/2015 0.2  0.0 - 0.7 K/uL Final  . Basophils Relative 10/06/2015 1  0 - 1 % Final  . Basophils Absolute 10/06/2015 0.0  0.0 - 0.1 K/uL Final  . Smear Review 10/06/2015 Criteria for review not met   Final  . PSA 10/06/2015 0.55  <=4.00 ng/mL Final   Comment: Test Methodology: ECLIA PSA (Electrochemiluminescence Immunoassay)   For PSA values from 2.5-4.0, particularly in younger men <53 years old, the AUA and NCCN suggest testing for % Free PSA (3515) and evaluation of the rate of increase in PSA (PSA velocity).    Lab work is excellent. PSA is virtually undetectable. Colonoscopy is up-to-date. Immunizations are up-to-date. He received his flu shot today. I will add a vitamin B12 level to  evaluate for other potential causes of peripheral neuropathy. Regular anticipatory guidance is provided area at follow-up in one year or as necessary.

## 2015-10-08 LAB — VITAMIN B12: VITAMIN B 12: 421 pg/mL (ref 211–911)

## 2015-11-06 DIAGNOSIS — H25811 Combined forms of age-related cataract, right eye: Secondary | ICD-10-CM | POA: Diagnosis not present

## 2015-11-06 DIAGNOSIS — H25011 Cortical age-related cataract, right eye: Secondary | ICD-10-CM | POA: Diagnosis not present

## 2015-11-06 DIAGNOSIS — H268 Other specified cataract: Secondary | ICD-10-CM | POA: Diagnosis not present

## 2015-11-13 DIAGNOSIS — H2512 Age-related nuclear cataract, left eye: Secondary | ICD-10-CM | POA: Diagnosis not present

## 2015-11-13 DIAGNOSIS — H25812 Combined forms of age-related cataract, left eye: Secondary | ICD-10-CM | POA: Diagnosis not present

## 2015-11-13 DIAGNOSIS — H25012 Cortical age-related cataract, left eye: Secondary | ICD-10-CM | POA: Diagnosis not present

## 2016-05-25 ENCOUNTER — Ambulatory Visit (INDEPENDENT_AMBULATORY_CARE_PROVIDER_SITE_OTHER): Payer: Medicare Other | Admitting: Family Medicine

## 2016-05-25 ENCOUNTER — Encounter: Payer: Self-pay | Admitting: Family Medicine

## 2016-05-25 VITALS — BP 98/60 | HR 64 | Temp 97.9°F | Resp 14 | Ht 76.0 in | Wt 144.0 lb

## 2016-05-25 DIAGNOSIS — Z0000 Encounter for general adult medical examination without abnormal findings: Secondary | ICD-10-CM

## 2016-05-25 NOTE — Progress Notes (Signed)
Subjective:    Patient ID: Bradley Beasley, male    DOB: 01-20-1933, 82 y.o.   MRN: [id number]  HPI  Wt Readings from Last 3 Encounters:  05/25/16 144 lb (65.318 kg)  10/07/15 146 lb (66.225 kg)  08/27/14 149 lb (67.586 kg)  Patient is a very pleasant 82 year old white male who is here today for complete physical exam. Colonoscopy is up-to-date last performed in 2015.  Patient's Pneumovax 23, Prevnar 13, shingles vaccine are up-to-date. PSA was checked in October 2016 and was 0.55. He denies any blood in the stool, dysuria, urgency, or hesitancy. He denies any falls or depression. He does continue to have some neuropathy in his feet with numbness in his distal toes but otherwise he is doing well with no concerns Past Medical History  Diagnosis Date  . H/O sarcoidosis   . History of BPH   . History of Helicobacter pylori infection   . GERD (gastroesophageal reflux disease)     w/ hiatal hernia  . COPD (chronic obstructive pulmonary disease) ([place])   . MVP (mitral valve prolapse)     MV regerg  . Tubular adenoma of colon   . Personal history of other malignant neoplasm of skin    Past Surgical History  Procedure Laterality Date  . Tonsillectomy    . Hernia repair    . Appendectomy     Current Outpatient Prescriptions on File Prior to Visit  Medication Sig Dispense Refill  . aspirin 81 MG tablet Take 81 mg by mouth daily.     No current facility-administered medications on file prior to visit.   Allergies  Allergen Reactions  . Celebrex [Celecoxib] Nausea And Vomiting  . Codeine   . Prevacid [Lansoprazole]    Social History   Social History  . Marital Status: Married    Spouse Name: N/A  . Number of Children: N/A  . Years of Education: N/A   Occupational History  . Not on file.   Social History Main Topics  . Smoking status: Never Smoker   . Smokeless tobacco: Never Used  . Alcohol Use: No  . Drug Use: No  . Sexual Activity: Not Currently   Other Topics  Concern  . Not on file   Social History Narrative   Family History  Problem Relation Age of Onset  . Cancer Mother     ovarian  . Cancer Father     pancreatic       Review of Systems  All other systems reviewed and are negative.      Objective:   Physical Exam  Constitutional: He is oriented to person, place, and time. He appears well-developed and well-nourished. No distress.  HENT:  Head: Normocephalic and atraumatic.  Right Ear: External ear normal.  Left Ear: External ear normal.  Nose: Nose normal.  Mouth/Throat: Oropharynx is clear and moist. No oropharyngeal exudate.  Eyes: Conjunctivae and EOM are normal. Pupils are equal, round, and reactive to light. Right eye exhibits no discharge. Left eye exhibits no discharge. No scleral icterus.  Neck: Normal range of motion. Neck supple. No JVD present. No tracheal deviation present. No thyromegaly present.  Cardiovascular: Normal rate, regular rhythm, normal heart sounds and intact distal pulses.  Exam reveals no gallop and no friction rub.   No murmur heard. Pulmonary/Chest: Effort normal and breath sounds normal. No stridor. No respiratory distress. He has no wheezes. He has no rales. He exhibits no tenderness.  Abdominal: Soft. Bowel sounds are normal. He exhibits  no distension and no mass. There is no tenderness. There is no rebound and no guarding.  Musculoskeletal: Normal range of motion. He exhibits no edema or tenderness.  Lymphadenopathy:    He has no cervical adenopathy.  Neurological: He is alert and oriented to person, place, and time. He has normal reflexes. No cranial nerve deficit. He exhibits normal muscle tone. Coordination normal.  Skin: Skin is warm. No rash noted. He is not diaphoretic. No erythema. No pallor.  Psychiatric: He has a normal mood and affect. His behavior is normal. Judgment and thought content normal.  Vitals reviewed.         Assessment & Plan:  Routine general medical examination  at a health care facility  Patient's physical exam is completely normal. I have include his most recent lab work below: No visits with results within 1 Month(s) from this visit. Latest known visit with results is:  Lab on 10/06/2015  Component Date Value Ref Range Status  . Sodium 10/06/2015 139  135 - 146 mmol/L Final  . Potassium 10/06/2015 4.5  3.5 - 5.3 mmol/L Final  . Chloride 10/06/2015 100  98 - 110 mmol/L Final  . CO2 10/06/2015 32* 20 - 31 mmol/L Final  . Glucose, Bld 10/06/2015 98  70 - 99 mg/dL Final  . BUN 10/06/2015 17  7 - 25 mg/dL Final  . Creat 10/06/2015 0.86  0.70 - 1.18 mg/dL Final  . Total Bilirubin 10/06/2015 1.0  0.2 - 1.2 mg/dL Final  . Alkaline Phosphatase 10/06/2015 66  40 - 115 U/L Final  . AST 10/06/2015 21  10 - 35 U/L Final  . ALT 10/06/2015 15  9 - 46 U/L Final  . Total Protein 10/06/2015 6.7  6.1 - 8.1 g/dL Final  . Albumin 10/06/2015 4.3  3.6 - 5.1 g/dL Final  . Calcium 10/06/2015 9.5  8.6 - 10.3 mg/dL Final  . GFR, Est African American 10/06/2015 >89  >=60 mL/min Final  . GFR, Est Non African American 10/06/2015 82  >=60 mL/min Final   Comment:   The estimated GFR is a calculation valid for adults (>=64 years old) that uses the CKD-EPI algorithm to adjust for age and sex. It is   not to be used for children, pregnant women, hospitalized patients,    patients on dialysis, or with rapidly changing kidney function. According to the NKDEP, eGFR >89 is normal, 60-89 shows mild impairment, 30-59 shows moderate impairment, 15-29 shows severe impairment and <15 is ESRD.     Marland Kitchen TSH 10/06/2015 2.110  0.350 - 4.500 uIU/mL Final  . Cholesterol 10/06/2015 168  125 - 200 mg/dL Final  . Triglycerides 10/06/2015 74  <150 mg/dL Final  . HDL 10/06/2015 63  >=40 mg/dL Final  . Total CHOL/HDL Ratio 10/06/2015 2.7  <=5.0 Ratio Final  . VLDL 10/06/2015 15  <30 mg/dL Final  . LDL Cholesterol 10/06/2015 90  <130 mg/dL Final   Comment:   Total Cholesterol/HDL  Ratio:CHD Risk                        Coronary Heart Disease Risk Table                                        Men       Women          1/2 Average Risk  3.4        3.3              Average Risk              5.0        4.4           2X Average Risk              9.6        7.1           3X Average Risk             23.4       11.0 Use the calculated Patient Ratio above and the CHD Risk table  to determine the patient's CHD Risk.   . WBC 10/06/2015 3.8* 4.0 - 10.5 K/uL Final  . RBC 10/06/2015 4.79  4.22 - 5.81 MIL/uL Final  . Hemoglobin 10/06/2015 15.2  13.0 - 17.0 g/dL Final  . HCT 10/06/2015 46.0  39.0 - 52.0 % Final  . MCV 10/06/2015 96.0  78.0 - 100.0 fL Final  . MCH 10/06/2015 31.7  26.0 - 34.0 pg Final  . MCHC 10/06/2015 33.0  30.0 - 36.0 g/dL Final  . RDW 10/06/2015 13.8  11.5 - 15.5 % Final  . Platelets 10/06/2015 164  150 - 400 K/uL Final  . MPV 10/06/2015 10.5  8.6 - 12.4 fL Final  . Neutrophils Relative % 10/06/2015 52  43 - 77 % Final  . Neutro Abs 10/06/2015 2.0  1.7 - 7.7 K/uL Final  . Lymphocytes Relative 10/06/2015 30  12 - 46 % Final  . Lymphs Abs 10/06/2015 1.1  0.7 - 4.0 K/uL Final  . Monocytes Relative 10/06/2015 12  3 - 12 % Final  . Monocytes Absolute 10/06/2015 0.5  0.1 - 1.0 K/uL Final  . Eosinophils Relative 10/06/2015 5  0 - 5 % Final  . Eosinophils Absolute 10/06/2015 0.2  0.0 - 0.7 K/uL Final  . Basophils Relative 10/06/2015 1  0 - 1 % Final  . Basophils Absolute 10/06/2015 0.0  0.0 - 0.1 K/uL Final  . Smear Review 10/06/2015 Criteria for review not met   Final  . PSA 10/06/2015 0.55  <=4.00 ng/mL Final   Comment: Test Methodology: ECLIA PSA (Electrochemiluminescence Immunoassay)   For PSA values from 2.5-4.0, particularly in younger men <60 years old, the AUA and NCCN suggest testing for % Free PSA (3515) and evaluation of the rate of increase in PSA (PSA velocity).    Lab work is excellent. PSA is virtually undetectable. Colonoscopy is  up-to-date. Immunizations are up-to-date. Recheck in one year or as needed.

## 2016-09-07 ENCOUNTER — Ambulatory Visit (INDEPENDENT_AMBULATORY_CARE_PROVIDER_SITE_OTHER): Payer: Medicare Other

## 2016-09-07 DIAGNOSIS — Z23 Encounter for immunization: Secondary | ICD-10-CM | POA: Diagnosis not present

## 2017-04-11 ENCOUNTER — Ambulatory Visit (INDEPENDENT_AMBULATORY_CARE_PROVIDER_SITE_OTHER): Payer: Medicare Other | Admitting: Family Medicine

## 2017-04-11 VITALS — BP 124/78 | HR 70 | Temp 97.7°F | Resp 14 | Ht 76.0 in | Wt 150.0 lb

## 2017-04-11 DIAGNOSIS — R42 Dizziness and giddiness: Secondary | ICD-10-CM | POA: Diagnosis not present

## 2017-04-11 DIAGNOSIS — H8110 Benign paroxysmal vertigo, unspecified ear: Secondary | ICD-10-CM

## 2017-04-11 LAB — CBC WITH DIFFERENTIAL/PLATELET
Basophils Absolute: 0 cells/uL (ref 0–200)
Basophils Relative: 0 %
EOS PCT: 3 %
Eosinophils Absolute: 147 cells/uL (ref 15–500)
HCT: 45.4 % (ref 38.5–50.0)
Hemoglobin: 14.9 g/dL (ref 13.0–17.0)
Lymphocytes Relative: 16 %
Lymphs Abs: 784 cells/uL — ABNORMAL LOW (ref 850–3900)
MCH: 31.8 pg (ref 27.0–33.0)
MCHC: 32.8 g/dL (ref 32.0–36.0)
MCV: 97 fL (ref 80.0–100.0)
MONOS PCT: 9 %
MPV: 10.4 fL (ref 7.5–12.5)
Monocytes Absolute: 441 cells/uL (ref 200–950)
Neutro Abs: 3528 cells/uL (ref 1500–7800)
Neutrophils Relative %: 72 %
PLATELETS: 170 10*3/uL (ref 140–400)
RBC: 4.68 MIL/uL (ref 4.20–5.80)
RDW: 14.2 % (ref 11.0–15.0)
WBC: 4.9 10*3/uL (ref 3.8–10.8)

## 2017-04-11 LAB — COMPLETE METABOLIC PANEL WITH GFR
ALT: 12 U/L (ref 9–46)
AST: 20 U/L (ref 10–35)
Albumin: 4.2 g/dL (ref 3.6–5.1)
Alkaline Phosphatase: 60 U/L (ref 40–115)
BUN: 17 mg/dL (ref 7–25)
CHLORIDE: 102 mmol/L (ref 98–110)
CO2: 28 mmol/L (ref 20–31)
Calcium: 9.5 mg/dL (ref 8.6–10.3)
Creat: 1 mg/dL (ref 0.70–1.11)
GFR, Est African American: 81 mL/min (ref >=60)
GFR, Est Non African American: 70 mL/min (ref >=60)
GLUCOSE: 100 mg/dL — AB (ref 70–99)
Potassium: 4.4 mmol/L (ref 3.5–5.3)
SODIUM: 139 mmol/L (ref 135–146)
TOTAL PROTEIN: 6.6 g/dL (ref 6.1–8.1)
Total Bilirubin: 1 mg/dL (ref 0.2–1.2)

## 2017-04-11 MED ORDER — MECLIZINE HCL 25 MG PO TABS
25.0000 mg | ORAL_TABLET | Freq: Three times a day (TID) | ORAL | 0 refills | Status: DC | PRN
Start: 2017-04-11 — End: 2018-09-22

## 2017-04-11 NOTE — Progress Notes (Signed)
Subjective:    Patient ID: Bradley Beasley, male    DOB: Mar 11, 1933, 83 y.o.   MRN: [id number]  HPI Patient is a very pleasant 83 year old white male here today complaining of dizziness. He states that this morning when he sat up suddenly in bed, he became extremely dizzy. He reports vertigo-like symptoms and became nauseated. Dizziness and vertigo is triggered with movement of the head such as sitting up or turning his head side to side. He denies any head injury. He denies any vision changes. He denies any neurologic deficits. He denies any sinus pain. He denies any otalgia. He has chronic hearing loss requiring hearing aids but nothing new. He denies any new tinnitus. Past Medical History:  Diagnosis Date  . COPD (chronic obstructive pulmonary disease) ([place])   . GERD (gastroesophageal reflux disease)    w/ hiatal hernia  . H/O sarcoidosis   . History of BPH   . History of Helicobacter pylori infection   . MVP (mitral valve prolapse)    MV regerg  . Personal history of other malignant neoplasm of skin   . Tubular adenoma of colon    Past Surgical History:  Procedure Laterality Date  . APPENDECTOMY    . HERNIA REPAIR    . TONSILLECTOMY     Current Outpatient Prescriptions on File Prior to Visit  Medication Sig Dispense Refill  . aspirin 81 MG tablet Take 81 mg by mouth daily.     No current facility-administered medications on file prior to visit.    Allergies  Allergen Reactions  . Celebrex [Celecoxib] Nausea And Vomiting  . Codeine   . Prevacid [Lansoprazole]    Social History   Social History  . Marital status: Married    Spouse name: N/A  . Number of children: N/A  . Years of education: N/A   Occupational History  . Not on file.   Social History Main Topics  . Smoking status: Never Smoker  . Smokeless tobacco: Never Used  . Alcohol use No  . Drug use: No  . Sexual activity: Not Currently   Other Topics Concern  . Not on file   Social History Narrative    . No narrative on file      Review of Systems  All other systems reviewed and are negative.      Objective:   Physical Exam  Constitutional: He is oriented to person, place, and time. He appears well-developed and well-nourished. No distress.  HENT:  Right Ear: External ear normal.  Left Ear: External ear normal.  Nose: Nose normal.  Mouth/Throat: Oropharynx is clear and moist. No oropharyngeal exudate.  Eyes: Conjunctivae and EOM are normal. Pupils are equal, round, and reactive to light.  Neck: Neck supple.  Cardiovascular: Normal rate, regular rhythm and normal heart sounds.   Pulmonary/Chest: Effort normal and breath sounds normal. No respiratory distress. He has no wheezes. He has no rales.  Abdominal: Soft. Bowel sounds are normal.  Lymphadenopathy:    He has no cervical adenopathy.  Neurological: He is alert and oriented to person, place, and time. He has normal reflexes. He displays normal reflexes. No cranial nerve deficit. He exhibits normal muscle tone. Coordination normal.  Skin: He is not diaphoretic.  Vitals reviewed.  Patient's cerebellar exam is completely normal. He does have a slightly positive Dix-Hallpike maneuver to the right when he sits back up, I am able to reach trigger his vertigo       Assessment & Plan:  Dizziness,  nonspecific - Plan: CBC with Differential/Platelet, COMPLETE METABOLIC PANEL WITH GFR  BPPV (benign paroxysmal positional vertigo), unspecified laterality - Plan: meclizine (ANTIVERT) 25 MG tablet  I believe the patient suffered from benign paroxysmal positional vertigo. I recommended tincture of time. He can use meclizine 25 mg every 8 hours as needed for severe vertigo but I caution the patient about possible drowsiness. I will also obtain baseline lab work to evaluate for anemia, etc. is also potential contributing factors. Recheck in one week if no better or sooner if worsening. There is no evidence of stroke or cerebellar  dysfunction on his exam today

## 2017-09-02 ENCOUNTER — Ambulatory Visit (INDEPENDENT_AMBULATORY_CARE_PROVIDER_SITE_OTHER): Payer: Medicare Other | Admitting: Family Medicine

## 2017-09-02 DIAGNOSIS — Z23 Encounter for immunization: Secondary | ICD-10-CM | POA: Diagnosis not present

## 2017-09-19 ENCOUNTER — Encounter: Payer: Self-pay | Admitting: Family Medicine

## 2017-09-19 ENCOUNTER — Ambulatory Visit (INDEPENDENT_AMBULATORY_CARE_PROVIDER_SITE_OTHER): Payer: Medicare Other | Admitting: Family Medicine

## 2017-09-19 VITALS — BP 118/70 | HR 69 | Temp 97.6°F | Resp 14 | Ht 74.5 in | Wt 148.0 lb

## 2017-09-19 DIAGNOSIS — Z0000 Encounter for general adult medical examination without abnormal findings: Secondary | ICD-10-CM | POA: Diagnosis not present

## 2017-09-19 DIAGNOSIS — Z13220 Encounter for screening for lipoid disorders: Secondary | ICD-10-CM | POA: Diagnosis not present

## 2017-09-19 DIAGNOSIS — M549 Dorsalgia, unspecified: Secondary | ICD-10-CM

## 2017-09-19 NOTE — Progress Notes (Signed)
Subjective:    Patient ID: Bradley Beasley, male    DOB: June 13, 1933, 83 y.o.   MRN: [id number]  HPI Patient is a very pleasant 83 year old white male who is here today for complete physical exam. Colonoscopy is up-to-date last performed in 2015.  He is not due for repeat colonoscopy due to age.  Patient's Pneumovax 23, Prevnar 13, shingles vaccine are up-to-date. Because of age, does not require PSA or DRE for prostate cancer screening.  He denies any falls or depression. He does report mid back pain radiating around trunk in dermatomal pattern into abdomen but otherwise he is doing well with no concerns. Past Medical History:  Diagnosis Date  . COPD (chronic obstructive pulmonary disease) ([place])   . GERD (gastroesophageal reflux disease)    w/ hiatal hernia  . H/O sarcoidosis   . History of BPH   . History of Helicobacter pylori infection   . MVP (mitral valve prolapse)    MV regerg  . Personal history of other malignant neoplasm of skin   . Tubular adenoma of colon    Past Surgical History:  Procedure Laterality Date  . APPENDECTOMY    . HERNIA REPAIR    . TONSILLECTOMY     Current Outpatient Prescriptions on File Prior to Visit  Medication Sig Dispense Refill  . aspirin 81 MG tablet Take 81 mg by mouth daily. Every other day    . meclizine (ANTIVERT) 25 MG tablet Take 1 tablet (25 mg total) by mouth 3 (three) times daily as needed for dizziness. 30 tablet 0   No current facility-administered medications on file prior to visit.    Allergies  Allergen Reactions  . Celebrex [Celecoxib] Nausea And Vomiting  . Codeine   . Prevacid [Lansoprazole]    Social History   Social History  . Marital status: Married    Spouse name: N/A  . Number of children: N/A  . Years of education: N/A   Occupational History  . Not on file.   Social History Main Topics  . Smoking status: Never Smoker  . Smokeless tobacco: Never Used  . Alcohol use No  . Drug use: No  . Sexual activity:  Not Currently   Other Topics Concern  . Not on file   Social History Narrative  . No narrative on file   Family History  Problem Relation Age of Onset  . Cancer Mother        ovarian  . Cancer Father        pancreatic       Review of Systems  All other systems reviewed and are negative.      Objective:   Physical Exam  Constitutional: He is oriented to person, place, and time. He appears well-developed and well-nourished. No distress.  HENT:  Head: Normocephalic and atraumatic.  Right Ear: External ear normal.  Left Ear: External ear normal.  Nose: Nose normal.  Mouth/Throat: Oropharynx is clear and moist. No oropharyngeal exudate.  Eyes: Pupils are equal, round, and reactive to light. Conjunctivae and EOM are normal. Right eye exhibits no discharge. Left eye exhibits no discharge. No scleral icterus.  Neck: Normal range of motion. Neck supple. No JVD present. No tracheal deviation present. No thyromegaly present.  Cardiovascular: Normal rate, regular rhythm, normal heart sounds and intact distal pulses.  Exam reveals no gallop and no friction rub.   No murmur heard. Pulmonary/Chest: Effort normal and breath sounds normal. No stridor. No respiratory distress. He has no wheezes. He  has no rales. He exhibits no tenderness.  Abdominal: Soft. Bowel sounds are normal. He exhibits no distension and no mass. There is no tenderness. There is no rebound and no guarding.  Musculoskeletal: Normal range of motion. He exhibits no edema or tenderness.  Lymphadenopathy:    He has no cervical adenopathy.  Neurological: He is alert and oriented to person, place, and time. He has normal reflexes. No cranial nerve deficit. He exhibits normal muscle tone. Coordination normal.  Skin: Skin is warm. No rash noted. He is not diaphoretic. No erythema. No pallor.  Psychiatric: He has a normal mood and affect. His behavior is normal. Judgment and thought content normal.  Vitals  reviewed.         Assessment & Plan:  Mid back pain - Plan: DG Thoracic Spine W/Swimmers  Screening for cholesterol level - Plan: CBC with Differential/Platelet, Lipid panel, COMPLETE METABOLIC PANEL WITH GFR  Routine general medical examination at a health care facility  Patient's physical exam is completely normal. Return fasting for cbc, cmp, flp.  Do not recommend psa due to age.  I will check a thoracic spine x-ray. I suspect back pain is due to degenerative disc disease and is experiencing thoracic radicular neuropathic pain however I will begin work up with T-spine first.  Immunizations are up-to-date. We did discuss shingrix and he will check on the price first prior to receiving it.

## 2017-09-20 ENCOUNTER — Ambulatory Visit
Admission: RE | Admit: 2017-09-20 | Discharge: 2017-09-20 | Disposition: A | Discharge status: Home / Self Care | Payer: Medicare Other | Source: Ambulatory Visit | Attending: Family Medicine | Admitting: Family Medicine

## 2017-09-20 DIAGNOSIS — M549 Dorsalgia, unspecified: Secondary | ICD-10-CM

## 2017-09-23 ENCOUNTER — Other Ambulatory Visit: Payer: Medicare Other

## 2017-09-23 LAB — CBC WITH DIFFERENTIAL/PLATELET
BASOS PCT: 0.7 %
Basophils Absolute: 28 cells/uL (ref 0–200)
Eosinophils Absolute: 180 cells/uL (ref 15–500)
Eosinophils Relative: 4.5 %
HEMATOCRIT: 44.5 % (ref 38.5–50.0)
Hemoglobin: 15.2 g/dL (ref 13.2–17.1)
LYMPHS ABS: 1260 {cells}/uL (ref 850–3900)
MCH: 31.9 pg (ref 27.0–33.0)
MCHC: 34.2 g/dL (ref 32.0–36.0)
MCV: 93.5 fL (ref 80.0–100.0)
MPV: 10.9 fL (ref 7.5–12.5)
Monocytes Relative: 10.7 %
Neutro Abs: 2104 cells/uL (ref 1500–7800)
Neutrophils Relative %: 52.6 %
Platelets: 167 10*3/uL (ref 140–400)
RBC: 4.76 10*6/uL (ref 4.20–5.80)
RDW: 12.7 % (ref 11.0–15.0)
Total Lymphocyte: 31.5 %
WBC: 4 10*3/uL (ref 3.8–10.8)
WBCMIX: 428 {cells}/uL (ref 200–950)

## 2017-09-23 LAB — COMPLETE METABOLIC PANEL WITH GFR
AG Ratio: 1.8 (calc) (ref 1.0–2.5)
ALBUMIN MSPROF: 4.2 g/dL (ref 3.6–5.1)
ALKALINE PHOSPHATASE (APISO): 61 U/L (ref 40–115)
ALT: 14 U/L (ref 9–46)
AST: 21 U/L (ref 10–35)
BILIRUBIN TOTAL: 1.2 mg/dL (ref 0.2–1.2)
BUN: 16 mg/dL (ref 7–25)
CO2: 31 mmol/L (ref 20–32)
Calcium: 9.6 mg/dL (ref 8.6–10.3)
Chloride: 102 mmol/L (ref 98–110)
Creat: 0.95 mg/dL (ref 0.70–1.11)
GFR, EST AFRICAN AMERICAN: 87 mL/min/{1.73_m2} (ref >=60)
GFR, Est Non African American: 75 mL/min/{1.73_m2} (ref >=60)
GLUCOSE: 92 mg/dL (ref 65–99)
Globulin: 2.4 g/dL (calc) (ref 1.9–3.7)
POTASSIUM: 4.6 mmol/L (ref 3.5–5.3)
Sodium: 139 mmol/L (ref 135–146)
TOTAL PROTEIN: 6.6 g/dL (ref 6.1–8.1)

## 2017-09-23 LAB — LIPID PANEL
Cholesterol: 154 mg/dL (ref <200)
HDL: 67 mg/dL (ref >40)
LDL CHOLESTEROL (CALC): 75 mg/dL
NON-HDL CHOLESTEROL (CALC): 87 mg/dL (ref <130)
TRIGLYCERIDES: 50 mg/dL (ref <150)
Total CHOL/HDL Ratio: 2.3 (calc) (ref <5.0)

## 2017-11-21 ENCOUNTER — Encounter: Payer: Self-pay | Admitting: Family Medicine

## 2017-11-21 ENCOUNTER — Ambulatory Visit: Payer: Medicare Other | Admitting: Family Medicine

## 2017-11-21 VITALS — BP 110/68 | HR 90 | Temp 98.4°F | Resp 14 | Ht 74.5 in | Wt 150.0 lb

## 2017-11-21 DIAGNOSIS — M25561 Pain in right knee: Secondary | ICD-10-CM | POA: Diagnosis not present

## 2017-11-21 NOTE — Progress Notes (Signed)
Subjective:    Patient ID: Bradley Beasley, male    DOB: 1932-12-17, 83 y.o.   MRN: [id number]  HPI His symptoms began in mid October he was kneeling down to work in his yard when he felt a sharp sudden pain in the medial aspect of his right knee.  The knee would then be sore and painful for a few weeks.  It was starting to improve but in November, he did the exact same thing again and felt sudden sharp pain in the right knee.  Ever since he has had dull throbbing aching pain in the medial right knee located along the joint line.  The pain is gradually improving over the last week.  He denies any locking or laxity in the knee joint.  There is no laxity to varus or valgus stress.  He has negative anterior and posterior drawer sign.  However he does have pain with Apley grind.  Differential diagnosis includes a meniscal tear/cartilage defect versus exacerbation of osteoarthritis Past Medical History:  Diagnosis Date  . COPD (chronic obstructive pulmonary disease) ([place])   . GERD (gastroesophageal reflux disease)    w/ hiatal hernia  . H/O sarcoidosis   . History of BPH   . History of Helicobacter pylori infection   . MVP (mitral valve prolapse)    MV regerg  . Personal history of other malignant neoplasm of skin   . Tubular adenoma of colon    Past Surgical History:  Procedure Laterality Date  . APPENDECTOMY    . HERNIA REPAIR    . TONSILLECTOMY     Current Outpatient Medications on File Prior to Visit  Medication Sig Dispense Refill  . aspirin 81 MG tablet Take 81 mg by mouth daily. Every other day    . meclizine (ANTIVERT) 25 MG tablet Take 1 tablet (25 mg total) by mouth 3 (three) times daily as needed for dizziness. (Patient not taking: Reported on 11/21/2017) 30 tablet 0   No current facility-administered medications on file prior to visit.    Allergies  Allergen Reactions  . Celebrex [Celecoxib] Nausea And Vomiting  . Codeine   . Prevacid [Lansoprazole]    Social History    Socioeconomic History  . Marital status: Married    Spouse name: Not on file  . Number of children: Not on file  . Years of education: Not on file  . Highest education level: Not on file  Social Needs  . Financial resource strain: Not on file  . Food insecurity - worry: Not on file  . Food insecurity - inability: Not on file  . Transportation needs - medical: Not on file  . Transportation needs - non-medical: Not on file  Occupational History  . Not on file  Tobacco Use  . Smoking status: Never Smoker  . Smokeless tobacco: Never Used  Substance and Sexual Activity  . Alcohol use: No  . Drug use: No  . Sexual activity: Not Currently  Other Topics Concern  . Not on file  Social History Narrative  . Not on file      Review of Systems  All other systems reviewed and are negative.      Objective:   Physical Exam  Cardiovascular: Normal rate, regular rhythm and normal heart sounds.  Pulmonary/Chest: Effort normal and breath sounds normal.  Musculoskeletal:       Right knee: He exhibits decreased range of motion and abnormal meniscus. He exhibits no swelling, no effusion, no LCL laxity, no bony  tenderness and no MCL laxity. Tenderness found. Medial joint line tenderness noted. No MCL tenderness noted.  Vitals reviewed.         Assessment & Plan:  Right medial knee pain  Differential diagnosis includes a meniscal tear/cartilage defect versus an exacerbation of osteoarthritis.  Using sterile technique, I injected the right knee with a mixture of 2 cc of lidocaine, 2 cc of Marcaine, and 2 cc of 40 mg/mL Kenalog.  The patient tolerated the procedure well without complication.  If the pain does not improve, I would proceed with imaging of the knee starting first with an x-ray and proceeding to an MRI if symptoms do not improve.  Patient will call me back if symptoms are no better over the next 2 weeks.

## 2017-12-14 ENCOUNTER — Encounter: Payer: Self-pay | Admitting: Family Medicine

## 2017-12-22 ENCOUNTER — Encounter: Payer: Self-pay | Admitting: *Deleted

## 2017-12-22 ENCOUNTER — Ambulatory Visit (INDEPENDENT_AMBULATORY_CARE_PROVIDER_SITE_OTHER): Payer: Medicare Other | Admitting: *Deleted

## 2017-12-22 DIAGNOSIS — Z23 Encounter for immunization: Secondary | ICD-10-CM

## 2017-12-22 NOTE — Progress Notes (Signed)
Patient seen in office for Shingles Vaccination.   Tolerated IM administration well.   Immunization history updated.  

## 2018-03-23 ENCOUNTER — Ambulatory Visit (INDEPENDENT_AMBULATORY_CARE_PROVIDER_SITE_OTHER): Payer: Medicare Other

## 2018-03-23 DIAGNOSIS — Z23 Encounter for immunization: Secondary | ICD-10-CM

## 2018-03-23 NOTE — Progress Notes (Signed)
Patient was in office for 2nd shingrix injection.Patient received injection in left arm

## 2018-07-13 IMAGING — CR DG THORACIC SPINE 3V
3 series · 3 of 3 positions shown · non-contrast
Comparison: 08/27/2014 and 03/23/2006 chest x-ray

CLINICAL DATA: 83-year-old male with mid back pain off and on for 4
years. Sarcoidosis. Initial encounter.

EXAM:
THORACIC SPINE - 3 VIEWS

[t t-spine a.p.]
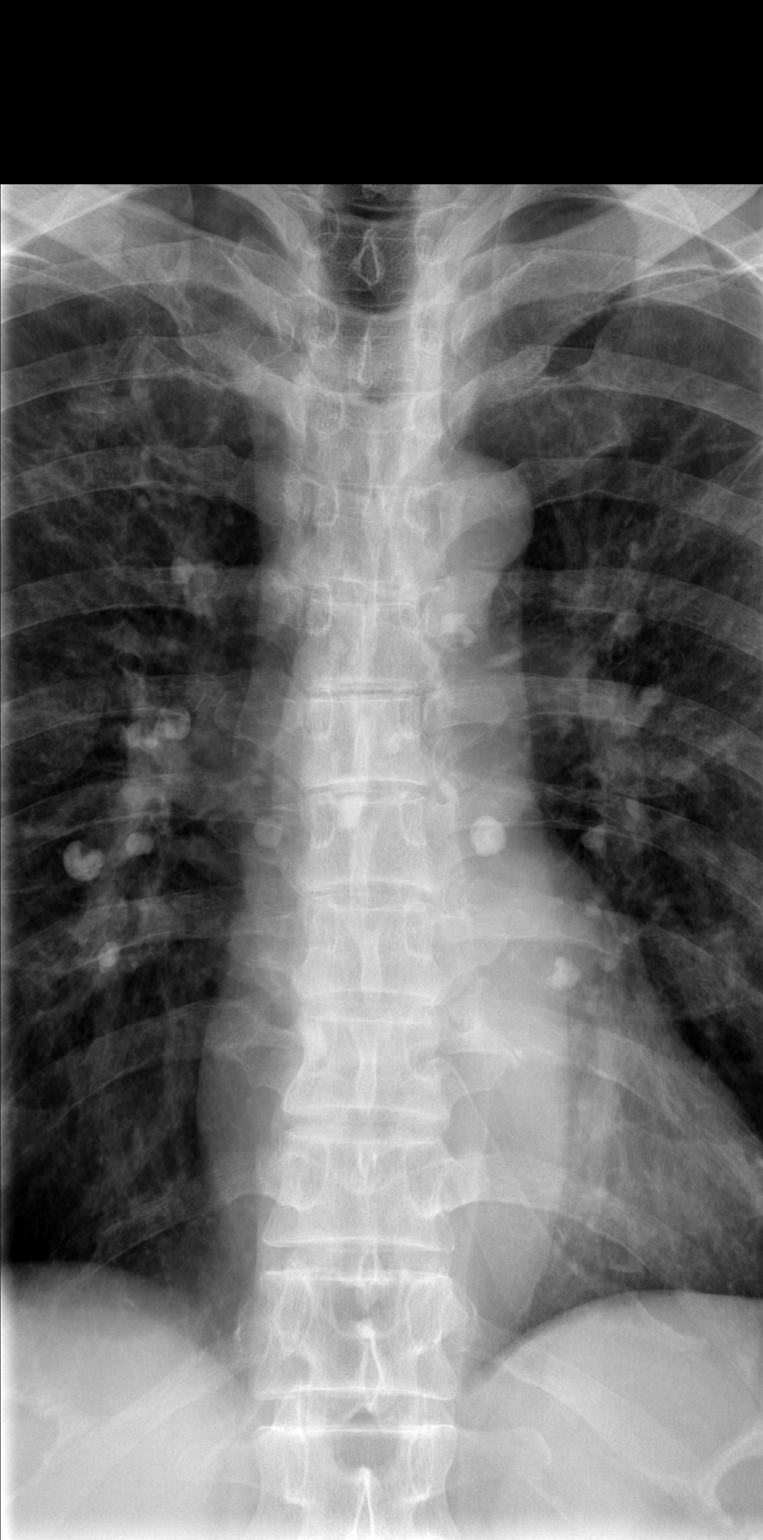

[t t-spine lat]
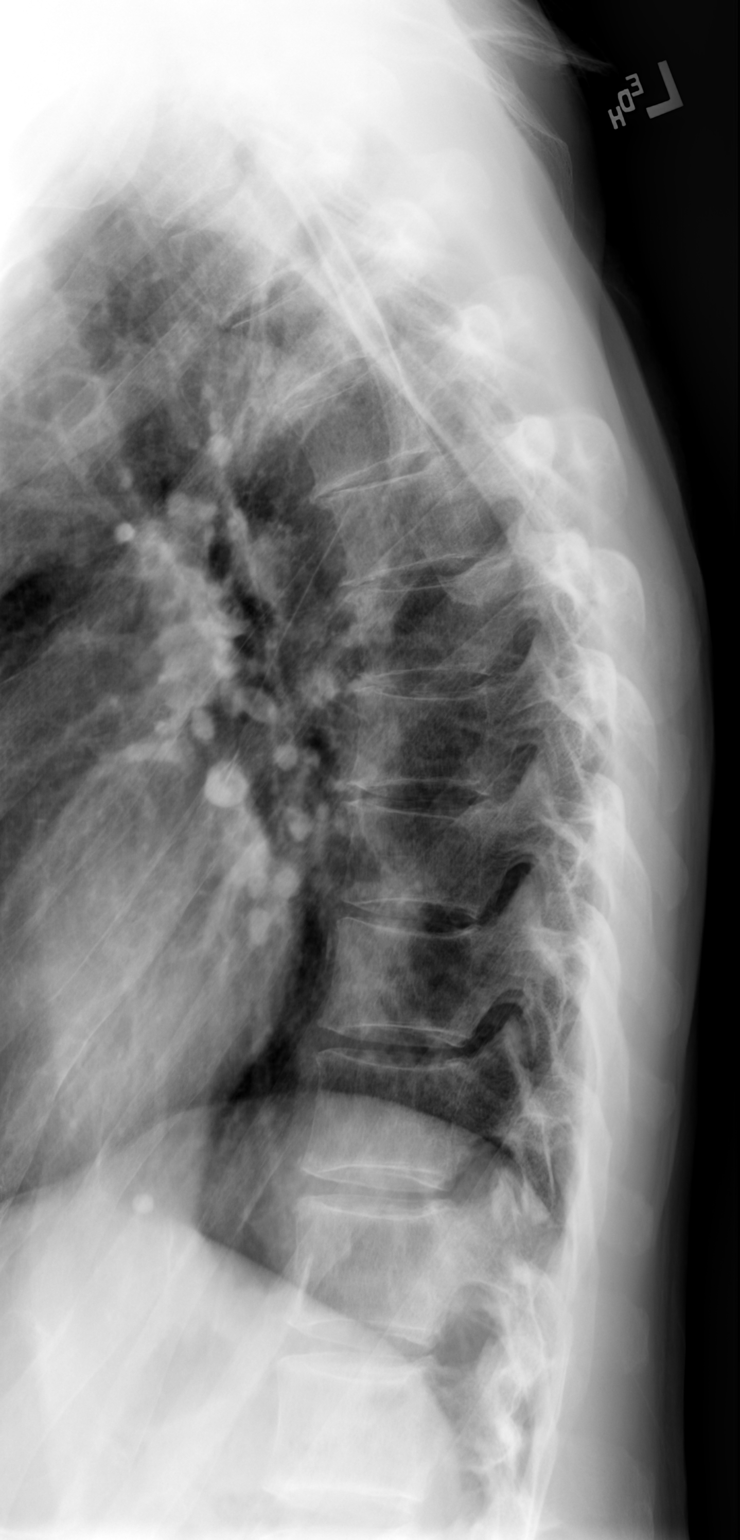

[t swimmers]
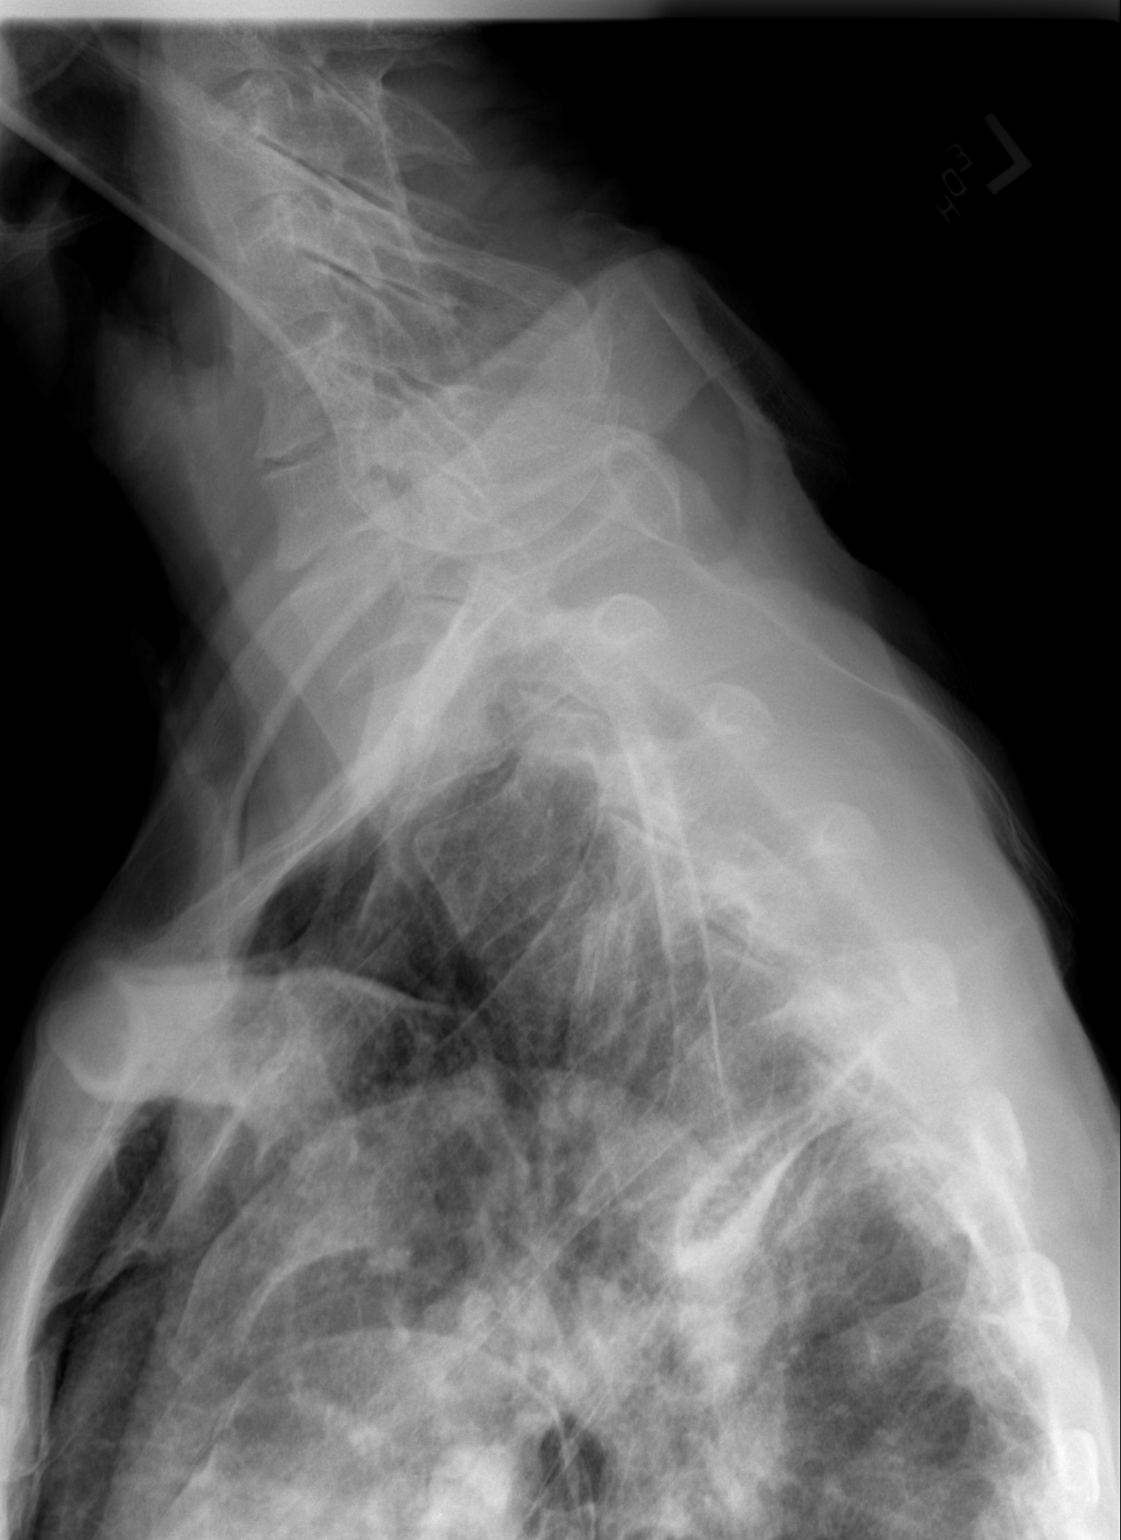

[3 of 3 positions shown; findings below may reference images not displayed]

FINDINGS: Mild thoracic kyphosis without focal compression fracture or
significant disc space narrowing.

Mild degenerative changes C4-5 thru C6-7.

Changes of sarcoidosis.
IMPRESSION: Mild thoracic kyphosis without focal compression fracture or
significant disc space narrowing.

## 2018-09-13 ENCOUNTER — Ambulatory Visit (INDEPENDENT_AMBULATORY_CARE_PROVIDER_SITE_OTHER): Payer: Medicare Other | Admitting: Family Medicine

## 2018-09-13 DIAGNOSIS — Z23 Encounter for immunization: Secondary | ICD-10-CM

## 2018-09-22 ENCOUNTER — Ambulatory Visit (INDEPENDENT_AMBULATORY_CARE_PROVIDER_SITE_OTHER): Payer: Medicare Other | Admitting: Family Medicine

## 2018-09-22 ENCOUNTER — Encounter: Payer: Self-pay | Admitting: Family Medicine

## 2018-09-22 VITALS — BP 118/72 | HR 72 | Temp 97.6°F | Resp 16 | Wt 138.0 lb

## 2018-09-22 DIAGNOSIS — N401 Enlarged prostate with lower urinary tract symptoms: Secondary | ICD-10-CM

## 2018-09-22 DIAGNOSIS — I341 Nonrheumatic mitral (valve) prolapse: Secondary | ICD-10-CM

## 2018-09-22 DIAGNOSIS — Z13220 Encounter for screening for lipoid disorders: Secondary | ICD-10-CM | POA: Diagnosis not present

## 2018-09-22 DIAGNOSIS — Z0000 Encounter for general adult medical examination without abnormal findings: Secondary | ICD-10-CM

## 2018-09-22 DIAGNOSIS — R3912 Poor urinary stream: Secondary | ICD-10-CM

## 2018-09-22 LAB — CBC WITH DIFFERENTIAL/PLATELET
BASOS ABS: 39 {cells}/uL (ref 0–200)
Basophils Relative: 0.8 %
EOS PCT: 3.5 %
Eosinophils Absolute: 172 cells/uL (ref 15–500)
HCT: 48.6 % (ref 38.5–50.0)
Hemoglobin: 16.1 g/dL (ref 13.2–17.1)
Lymphs Abs: 1215 cells/uL (ref 850–3900)
MCH: 31.3 pg (ref 27.0–33.0)
MCHC: 33.1 g/dL (ref 32.0–36.0)
MCV: 94.4 fL (ref 80.0–100.0)
MONOS PCT: 11.4 %
MPV: 10.7 fL (ref 7.5–12.5)
NEUTROS PCT: 59.5 %
Neutro Abs: 2916 cells/uL (ref 1500–7800)
Platelets: 176 10*3/uL (ref 140–400)
RBC: 5.15 10*6/uL (ref 4.20–5.80)
RDW: 12.4 % (ref 11.0–15.0)
Total Lymphocyte: 24.8 %
WBC mixed population: 559 cells/uL (ref 200–950)
WBC: 4.9 10*3/uL (ref 3.8–10.8)

## 2018-09-22 LAB — COMPLETE METABOLIC PANEL WITH GFR
AG Ratio: 1.7 (calc) (ref 1.0–2.5)
ALKALINE PHOSPHATASE (APISO): 64 U/L (ref 40–115)
ALT: 17 U/L (ref 9–46)
AST: 23 U/L (ref 10–35)
Albumin: 4.7 g/dL (ref 3.6–5.1)
BILIRUBIN TOTAL: 1 mg/dL (ref 0.2–1.2)
BUN: 16 mg/dL (ref 7–25)
CHLORIDE: 101 mmol/L (ref 98–110)
CO2: 30 mmol/L (ref 20–32)
Calcium: 10.1 mg/dL (ref 8.6–10.3)
Creat: 0.91 mg/dL (ref 0.70–1.11)
GFR, Est African American: 91 mL/min/{1.73_m2} (ref >=60)
GFR, Est Non African American: 78 mL/min/{1.73_m2} (ref >=60)
GLUCOSE: 105 mg/dL — AB (ref 65–99)
Globulin: 2.7 g/dL (calc) (ref 1.9–3.7)
POTASSIUM: 4.4 mmol/L (ref 3.5–5.3)
Sodium: 141 mmol/L (ref 135–146)
Total Protein: 7.4 g/dL (ref 6.1–8.1)

## 2018-09-22 LAB — LIPID PANEL
Cholesterol: 189 mg/dL (ref <200)
HDL: 68 mg/dL (ref >40)
LDL CHOLESTEROL (CALC): 105 mg/dL — AB
Non-HDL Cholesterol (Calc): 121 mg/dL (calc) (ref <130)
TRIGLYCERIDES: 75 mg/dL (ref <150)
Total CHOL/HDL Ratio: 2.8 (calc) (ref <5.0)

## 2018-09-22 LAB — PSA: PSA: 0.5 ng/mL (ref <=4.0)

## 2018-09-22 NOTE — Progress Notes (Signed)
Subjective:    Patient ID: Bradley Beasley, male    DOB: 1933-06-16, 84 y.o.   MRN: [id number]  HPI Patient is here today for complete physical exam.  Overall he is doing extremely well outside of some arthritis in his neck and his right shoulder.  He also reports lower urinary tract symptoms.  He reports weak stream, 2 episodes of nocturia on average per evening, incomplete bladder evacuation.  However the symptoms are mild and he does not request treatment.  Immunizations are updated and are up-to-date as shown below.  Due to age she does not require repeat colonoscopy.  The remainder of his review of systems is negative Immunization History  Administered Date(s) Administered  . Influenza, High Dose Seasonal PF 09/13/2018  . Influenza,inj,Quad PF,6+ Mos 09/05/2013, 09/03/2014, 09/07/2016, 09/02/2017  . Pneumococcal Conjugate-13 11/12/2013  . Pneumococcal Polysaccharide-23 01/10/2006  . Tdap 08/11/2011  . Zoster 11/23/2007  . Zoster Recombinat (Shingrix) 12/22/2017, 03/23/2018    Past Medical History:  Diagnosis Date  . COPD (chronic obstructive pulmonary disease) ([place])   . GERD (gastroesophageal reflux disease)    w/ hiatal hernia  . H/O sarcoidosis   . History of BPH   . History of Helicobacter pylori infection   . MVP (mitral valve prolapse)    MV regerg  . Personal history of other malignant neoplasm of skin   . Tubular adenoma of colon    Past Surgical History:  Procedure Laterality Date  . APPENDECTOMY    . HERNIA REPAIR    . TONSILLECTOMY     No current outpatient medications on file prior to visit.   No current facility-administered medications on file prior to visit.    Allergies  Allergen Reactions  . Celebrex [Celecoxib] Nausea And Vomiting  . Codeine   . Prevacid [Lansoprazole]    Social History   Socioeconomic History  . Marital status: Married    Spouse name: Not on file  . Number of children: Not on file  . Years of education: Not on file  .  Highest education level: Not on file  Occupational History  . Not on file  Social Needs  . Financial resource strain: Not on file  . Food insecurity:    Worry: Not on file    Inability: Not on file  . Transportation needs:    Medical: Not on file    Non-medical: Not on file  Tobacco Use  . Smoking status: Never Smoker  . Smokeless tobacco: Never Used  Substance and Sexual Activity  . Alcohol use: No  . Drug use: No  . Sexual activity: Not Currently  Lifestyle  . Physical activity:    Days per week: Not on file    Minutes per session: Not on file  . Stress: Not on file  Relationships  . Social connections:    Talks on phone: Not on file    Gets together: Not on file    Attends religious service: Not on file    Active member of club or organization: Not on file    Attends meetings of clubs or organizations: Not on file    Relationship status: Not on file  . Intimate partner violence:    Fear of current or ex partner: Not on file    Emotionally abused: Not on file    Physically abused: Not on file    Forced sexual activity: Not on file  Other Topics Concern  . Not on file  Social History Narrative  .  Not on file   Family History  Problem Relation Age of Onset  . Cancer Mother        ovarian  . Cancer Father        pancreatic       Review of Systems  All other systems reviewed and are negative.      Objective:   Physical Exam  Constitutional: He is oriented to person, place, and time. He appears well-developed and well-nourished. No distress.  HENT:  Head: Normocephalic and atraumatic.  Right Ear: External ear normal.  Left Ear: External ear normal.  Nose: Nose normal.  Mouth/Throat: Oropharynx is clear and moist. No oropharyngeal exudate.  Eyes: Pupils are equal, round, and reactive to light. Conjunctivae and EOM are normal. Right eye exhibits no discharge. Left eye exhibits no discharge. No scleral icterus.  Neck: Normal range of motion. Neck supple.  No JVD present. No tracheal deviation present. No thyromegaly present.  Cardiovascular: Normal rate, regular rhythm, normal heart sounds and intact distal pulses. Exam reveals no gallop and no friction rub.  No murmur heard. Pulmonary/Chest: Effort normal and breath sounds normal. No stridor. No respiratory distress. He has no wheezes. He has no rales. He exhibits no tenderness.  Abdominal: Soft. Bowel sounds are normal. He exhibits no distension and no mass. There is no tenderness. There is no rebound and no guarding.  Genitourinary: Rectum normal. Prostate is enlarged.  Musculoskeletal: Normal range of motion. He exhibits no edema or tenderness.  Lymphadenopathy:    He has no cervical adenopathy.  Neurological: He is alert and oriented to person, place, and time. He has normal reflexes. No cranial nerve deficit. He exhibits normal muscle tone. Coordination normal.  Skin: Skin is warm. No rash noted. He is not diaphoretic. No erythema. No pallor.  Psychiatric: He has a normal mood and affect. His behavior is normal. Judgment and thought content normal.  Vitals reviewed.         Assessment & Plan:  Screening cholesterol level - Plan: CBC with Differential/Platelet, COMPLETE METABOLIC PANEL WITH GFR, Lipid panel  Benign prostatic hyperplasia with weak urinary stream - Plan: PSA  Routine general medical examination at a health care facility  MVP (mitral valve prolapse)  Outside of some mild BPH, the patient's physical exam is.  Given the change in his urinary symptoms I will also check a PSA.  I will check a CBC, CMP, fasting lipid panel.  His immunizations are up-to-date.  He is not due for any other cancer screening.  The remainder of his review of systems is negative.  Await results of his PSA.  We discussed treatment options including saw palmetto, Flomax, finasteride, and at the present time the patient denies needing any medication.

## 2018-09-25 ENCOUNTER — Encounter: Payer: Self-pay | Admitting: *Deleted

## 2019-05-10 ENCOUNTER — Ambulatory Visit: Payer: Medicare Other | Admitting: Family Medicine

## 2019-05-10 ENCOUNTER — Other Ambulatory Visit: Payer: Self-pay

## 2019-05-10 ENCOUNTER — Encounter: Payer: Self-pay | Admitting: Family Medicine

## 2019-05-10 VITALS — BP 110/56 | HR 73 | Temp 98.3°F | Resp 18 | Wt 145.8 lb

## 2019-05-10 DIAGNOSIS — W57XXXA Bitten or stung by nonvenomous insect and other nonvenomous arthropods, initial encounter: Secondary | ICD-10-CM | POA: Diagnosis not present

## 2019-05-10 DIAGNOSIS — S30861A Insect bite (nonvenomous) of abdominal wall, initial encounter: Secondary | ICD-10-CM

## 2019-05-10 MED ORDER — DOXYCYCLINE HYCLATE 100 MG PO TABS: 100.0000 mg | ORAL_TABLET | Freq: Two times a day (BID) | ORAL | 0 refills | Status: DC

## 2019-05-10 NOTE — Progress Notes (Signed)
   Subjective:    Patient ID: Bradley Beasley, male    DOB: 10-03-1933, 85 y.o.   MRN: [id number]  Patient presents for Tick Removal (R side of groin, found it on 05/30, bodyaches all over, headache, rash on face, tylenol was taken)  Here with daughter  Saturday pulled tick off right lower groin Monday started with body aches, joints pains nO FEVER, has been taking a little tylenol No N/V, no diarrhea , no cough, congestion, appetite is good Has new red splotches on face, seems more than usual but also has lots of skin cancers Has not seen bulls eye  Used tweezers to pull off   NO known reactions to ticks in the past    Review Of Systems:  GEN- + fatigue, fever, weight loss,weakness, recent illness HEENT- denies eye drainage, change in vision, nasal discharge, CVS- denies chest pain, palpitations RESP- denies SOB, cough, wheeze ABD- denies N/V, change in stools, abd pain GU- denies dysuria, hematuria, dribbling, incontinence MSK- denies joint pain, +muscle aches, injury Neuro- +headache, denies dizziness, syncope, seizure activity       Objective:    BP (!) 110/56   Pulse 73   Temp 98.3 F (36.8 C)   Resp 18   Wt 145 lb 12.8 oz (66.1 kg)   SpO2 95%   BMI 18.47 kg/m  GEN- NAD, alert and oriented x3, healthy appearing for age  HEENT- PERRL, EOMI, non injected sclera, pink conjunctiva, MMM, oropharynx clear Neck- Supple, no LAD CVS- RRR, no murmur RESP-CTAB ABD-NABS,soft,NT,ND Skin- erythematous dime size indurated lesion,scab at center, no inguinal nodes palpated, no bull's eye rash Has multiple red spots, nevi on back, face EXT- No edema, no joint effusions, moving all 4 ext  Pulses- Radial, 2+        Assessment & Plan:      Problem List Items Addressed This Visit    None    Visit Diagnoses    Tick bite of groin, initial encounter    -  Primary   tick bite with myalgias, no fever, overall looks well, but with symptoms presenting, start doxycycline  antibiotic, if he worsens would check labs No respiratory or GI symptoms to suggest COVID-19 at this time      Note: This dictation was prepared with Dragon dictation along with smaller phrase technology. Any transcriptional errors that result from this process are unintentional.

## 2019-05-10 NOTE — Patient Instructions (Signed)
F/U as needed

## 2019-07-30 ENCOUNTER — Other Ambulatory Visit: Payer: Self-pay

## 2019-07-31 ENCOUNTER — Encounter: Payer: Self-pay | Admitting: Family Medicine

## 2019-07-31 ENCOUNTER — Ambulatory Visit (INDEPENDENT_AMBULATORY_CARE_PROVIDER_SITE_OTHER): Payer: Medicare Other | Admitting: Family Medicine

## 2019-07-31 VITALS — BP 120/70 | HR 76 | Temp 97.9°F | Resp 12 | Ht 74.5 in | Wt 144.0 lb

## 2019-07-31 DIAGNOSIS — R630 Anorexia: Secondary | ICD-10-CM | POA: Diagnosis not present

## 2019-07-31 DIAGNOSIS — R0602 Shortness of breath: Secondary | ICD-10-CM | POA: Diagnosis not present

## 2019-07-31 MED ORDER — MIRTAZAPINE 30 MG PO TABS: 30.0000 mg | ORAL_TABLET | Freq: Every day | ORAL | 2 refills | Status: DC

## 2019-07-31 NOTE — Progress Notes (Signed)
Subjective:    Patient ID: Bradley Beasley, male    DOB: [birth year], 85 y.o.   MRN: [id number]  HPI Patient is here today with his daughter.  He states that he has no appetite.  He will sit down to eat and have to force himself to eat.  He has no desire to eat.  He also reports trouble sleeping.  He has also had several episodes where he will be out in his garden and suddenly become short of breath for no reason.  He will start hyperventilating.  It lasts a few minutes and then it will subside.  It usually occurs when he is outside in the heat.  He denies any chest pain.  The events occur randomly and with no provocation.  He also reports lack of energy and fatigue.  His daughter believes that this is due to stress.  He is not able to play golf.  He has been quarantined at home unable to go anywhere or do anything.  He is extremely isolated.  Patient denies any angina.  He denies any pleurisy.  He denies any cough or hemoptysis.  He denies any nausea or vomiting.  He does report early satiety.  He reports very little appetite.  He denies any diarrhea or constipation.  He denies any melena or hematochezia.  He denies any fever or chills. Wt Readings from Last 3 Encounters:  07/31/19 144 lb (65.3 kg)  05/10/19 145 lb 12.8 oz (66.1 kg)  09/22/18 138 lb (62.6 kg)   Compared to last year his weight is actually stable or up. Past Medical History:  Diagnosis Date  . COPD (chronic obstructive pulmonary disease) ([place])   . GERD (gastroesophageal reflux disease)    w/ hiatal hernia  . H/O sarcoidosis   . History of BPH   . History of Helicobacter pylori infection   . MVP (mitral valve prolapse)    MV regerg  . Personal history of other malignant neoplasm of skin   . Tubular adenoma of colon    Past Surgical History:  Procedure Laterality Date  . APPENDECTOMY    . HERNIA REPAIR    . TONSILLECTOMY     No current outpatient medications on file prior to visit.   No current facility-administered  medications on file prior to visit.    Allergies  Allergen Reactions  . Celebrex [Celecoxib] Nausea And Vomiting  . Codeine   . Prevacid [Lansoprazole]    Social History   Socioeconomic History  . Marital status: Married    Spouse name: Not on file  . Number of children: Not on file  . Years of education: Not on file  . Highest education level: Not on file  Occupational History  . Not on file  Social Needs  . Financial resource strain: Not on file  . Food insecurity    Worry: Not on file    Inability: Not on file  . Transportation needs    Medical: Not on file    Non-medical: Not on file  Tobacco Use  . Smoking status: Never Smoker  . Smokeless tobacco: Never Used  Substance and Sexual Activity  . Alcohol use: No  . Drug use: No  . Sexual activity: Not Currently  Lifestyle  . Physical activity    Days per week: Not on file    Minutes per session: Not on file  . Stress: Not on file  Relationships  . Social connections    Talks on phone: Not on  file    Gets together: Not on file    Attends religious service: Not on file    Active member of club or organization: Not on file    Attends meetings of clubs or organizations: Not on file    Relationship status: Not on file  . Intimate partner violence    Fear of current or ex partner: Not on file    Emotionally abused: Not on file    Physically abused: Not on file    Forced sexual activity: Not on file  Other Topics Concern  . Not on file  Social History Narrative  . Not on file   Family History  Problem Relation Age of Onset  . Cancer Mother        ovarian  . Cancer Father        pancreatic       Review of Systems  All other systems reviewed and are negative.      Objective:   Physical Exam  Constitutional: He is oriented to person, place, and time. He appears well-developed and well-nourished. No distress.  HENT:  Head: Normocephalic and atraumatic.  Right Ear: External ear normal.  Left Ear:  External ear normal.  Nose: Nose normal.  Mouth/Throat: Oropharynx is clear and moist. No oropharyngeal exudate.  Eyes: Pupils are equal, round, and reactive to light. Conjunctivae and EOM are normal. Right eye exhibits no discharge. Left eye exhibits no discharge. No scleral icterus.  Neck: Normal range of motion. Neck supple. No JVD present. No tracheal deviation present. No thyromegaly present.  Cardiovascular: Normal rate, regular rhythm, normal heart sounds and intact distal pulses. Exam reveals no gallop and no friction rub.  No murmur heard. Pulmonary/Chest: Effort normal and breath sounds normal. No stridor. No respiratory distress. He has no wheezes. He has no rales. He exhibits no tenderness.  Abdominal: Soft. Bowel sounds are normal. He exhibits no distension and no mass. There is no abdominal tenderness. There is no rebound and no guarding.  Genitourinary:    Rectum normal.  Prostate is enlarged.  Musculoskeletal: Normal range of motion.        General: No tenderness or edema.  Lymphadenopathy:    He has no cervical adenopathy.  Neurological: He is alert and oriented to person, place, and time. He has normal reflexes. No cranial nerve deficit. He exhibits normal muscle tone. Coordination normal.  Skin: Skin is warm. No rash noted. He is not diaphoretic. No erythema. No pallor.  Psychiatric: He has a normal mood and affect. His behavior is normal. Judgment and thought content normal.  Vitals reviewed.         Assessment & Plan:  SOB (shortness of breath) - Plan: EKG 12-Lead  Loss of appetite - Plan: CBC with Differential/Platelet, COMPLETE METABOLIC PANEL WITH GFR, TSH, Sedimentation rate  I believe the patient's symptoms most likely are due to stress anxiety and depression.  He has insomnia, poor appetite, lack of energy, panic attacks.  I believe the sudden episodes of shortness of breath or panic attacks.  To work this up further I did obtain an EKG.  It does show a stable  right bundle branch block that is chronic dating back to 2015.  I will obtain an echocardiogram to evaluate for any underlying cardiac etiology such as decreased left ventricular ejection fraction, etc.  However I believe that his sudden shortness of breath and tachypnea are likely anxiety attacks.  I believe his poor appetite and poor sleep and lack of  energy are possibly due to depression.  Therefore I recommended trying Remeron 30 mg a day to see if we can help him sleep better and also help stimulate his appetite and improve depression.  Meanwhile I will obtain lab work to rule out other underlying causes including a CBC to rule out anemia or evidence of bone marrow abnormalities, CMP to evaluate for any electrolyte abnormalities, a TSH to evaluate for thyroid issues, and a sed rate to evaluate for any underlying autoimmune diseases or malignancy.  If labs are normal and echocardiogram is normal, I would recommend continuing the medication for depression and then reassessing in 3 to 4 weeks.

## 2019-08-01 LAB — COMPLETE METABOLIC PANEL WITH GFR
AG Ratio: 1.9 (calc) (ref 1.0–2.5)
ALT: 12 U/L (ref 9–46)
AST: 20 U/L (ref 10–35)
Albumin: 4.5 g/dL (ref 3.6–5.1)
Alkaline phosphatase (APISO): 47 U/L (ref 35–144)
BUN: 19 mg/dL (ref 7–25)
CO2: 31 mmol/L (ref 20–32)
Calcium: 9.8 mg/dL (ref 8.6–10.3)
Chloride: 101 mmol/L (ref 98–110)
Creat: 0.95 mg/dL (ref 0.70–1.11)
GFR, Est African American: 85 mL/min/{1.73_m2} (ref >=60)
GFR, Est Non African American: 74 mL/min/{1.73_m2} (ref >=60)
Globulin: 2.4 g/dL (calc) (ref 1.9–3.7)
Glucose, Bld: 88 mg/dL (ref 65–99)
Potassium: 4.3 mmol/L (ref 3.5–5.3)
Sodium: 139 mmol/L (ref 135–146)
Total Bilirubin: 0.9 mg/dL (ref 0.2–1.2)
Total Protein: 6.9 g/dL (ref 6.1–8.1)

## 2019-08-01 LAB — CBC WITH DIFFERENTIAL/PLATELET
Absolute Monocytes: 642 cells/uL (ref 200–950)
Basophils Absolute: 29 cells/uL (ref 0–200)
Basophils Relative: 0.6 %
Eosinophils Absolute: 172 cells/uL (ref 15–500)
Eosinophils Relative: 3.5 %
HCT: 45.5 % (ref 38.5–50.0)
Hemoglobin: 15.2 g/dL (ref 13.2–17.1)
Lymphs Abs: 1406 cells/uL (ref 850–3900)
MCH: 32.1 pg (ref 27.0–33.0)
MCHC: 33.4 g/dL (ref 32.0–36.0)
MCV: 96 fL (ref 80.0–100.0)
MPV: 11.1 fL (ref 7.5–12.5)
Monocytes Relative: 13.1 %
Neutro Abs: 2651 cells/uL (ref 1500–7800)
Neutrophils Relative %: 54.1 %
Platelets: 186 10*3/uL (ref 140–400)
RBC: 4.74 10*6/uL (ref 4.20–5.80)
RDW: 13.1 % (ref 11.0–15.0)
Total Lymphocyte: 28.7 %
WBC: 4.9 10*3/uL (ref 3.8–10.8)

## 2019-08-01 LAB — SEDIMENTATION RATE: Sed Rate: 2 mm/h (ref 0–20)

## 2019-08-01 LAB — TSH: TSH: 2.58 mIU/L (ref 0.40–4.50)

## 2019-08-02 ENCOUNTER — Other Ambulatory Visit (HOSPITAL_COMMUNITY): Payer: Medicare Other

## 2019-08-03 ENCOUNTER — Ambulatory Visit (HOSPITAL_COMMUNITY): Payer: Medicare Other | Attending: Cardiology

## 2019-08-03 ENCOUNTER — Other Ambulatory Visit: Payer: Self-pay

## 2019-08-03 DIAGNOSIS — R0602 Shortness of breath: Secondary | ICD-10-CM

## 2019-08-14 ENCOUNTER — Other Ambulatory Visit: Payer: Self-pay

## 2019-08-14 DIAGNOSIS — R0602 Shortness of breath: Secondary | ICD-10-CM

## 2019-08-28 ENCOUNTER — Other Ambulatory Visit: Payer: Self-pay

## 2019-08-30 ENCOUNTER — Other Ambulatory Visit: Payer: Self-pay

## 2019-08-30 ENCOUNTER — Ambulatory Visit (INDEPENDENT_AMBULATORY_CARE_PROVIDER_SITE_OTHER): Payer: Medicare Other | Admitting: *Deleted

## 2019-08-30 DIAGNOSIS — Z23 Encounter for immunization: Secondary | ICD-10-CM

## 2019-08-30 NOTE — Progress Notes (Signed)
Patient seen in office for Influenza Vaccination.   Tolerated IM administration well.   Immunization history updated.  

## 2019-09-24 ENCOUNTER — Other Ambulatory Visit: Payer: Self-pay

## 2019-09-25 ENCOUNTER — Ambulatory Visit (INDEPENDENT_AMBULATORY_CARE_PROVIDER_SITE_OTHER): Payer: Medicare Other | Admitting: Family Medicine

## 2019-09-25 ENCOUNTER — Encounter: Payer: Self-pay | Admitting: Family Medicine

## 2019-09-25 VITALS — BP 126/70 | HR 76 | Temp 97.6°F | Resp 16 | Ht 74.5 in | Wt 149.0 lb

## 2019-09-25 DIAGNOSIS — R0602 Shortness of breath: Secondary | ICD-10-CM

## 2019-09-25 DIAGNOSIS — M542 Cervicalgia: Secondary | ICD-10-CM

## 2019-09-25 DIAGNOSIS — Z0001 Encounter for general adult medical examination with abnormal findings: Secondary | ICD-10-CM | POA: Diagnosis not present

## 2019-09-25 DIAGNOSIS — Z0000 Encounter for general adult medical examination without abnormal findings: Secondary | ICD-10-CM

## 2019-09-25 MED ORDER — MELOXICAM 15 MG PO TABS: 15.0000 mg | ORAL_TABLET | Freq: Every day | ORAL | 0 refills | Status: DC

## 2019-09-25 NOTE — Progress Notes (Signed)
Subjective:    Patient ID: Bradley Beasley, male    DOB: 20-Dec-1932, 85 y.o.   MRN: [id number]  HPI  07/31/19 Patient is here today with his daughter.  He states that he has no appetite.  He will sit down to eat and have to force himself to eat.  He has no desire to eat.  He also reports trouble sleeping.  He has also had several episodes where he will be out in his garden and suddenly become short of breath for no reason.  He will start hyperventilating.  It lasts a few minutes and then it will subside.  It usually occurs when he is outside in the heat.  He denies any chest pain.  The events occur randomly and with no provocation.  He also reports lack of energy and fatigue.  His daughter believes that this is due to stress.  He is not able to play golf.  He has been quarantined at home unable to go anywhere or do anything.  He is extremely isolated.  Patient denies any angina.  He denies any pleurisy.  He denies any cough or hemoptysis.  He denies any nausea or vomiting.  He does report early satiety.  He reports very little appetite.  He denies any diarrhea or constipation.  He denies any melena or hematochezia.  He denies any fever or chills. Wt Readings from Last 3 Encounters:  09/25/19 149 lb (67.6 kg)  07/31/19 144 lb (65.3 kg)  05/10/19 145 lb 12.8 oz (66.1 kg)   Compared to last year his weight is actually stable or up.  At that time, my plan was: I believe the patient's symptoms most likely are due to stress anxiety and depression.  He has insomnia, poor appetite, lack of energy, panic attacks.  I believe the sudden episodes of shortness of breath or panic attacks.  To work this up further I did obtain an EKG.  It does show a stable right bundle branch block that is chronic dating back to 2015.  I will obtain an echocardiogram to evaluate for any underlying cardiac etiology such as decreased left ventricular ejection fraction, etc.  However I believe that his sudden shortness of breath and  tachypnea are likely anxiety attacks.  I believe his poor appetite and poor sleep and lack of energy are possibly due to depression.  Therefore I recommended trying Remeron 30 mg a day to see if we can help him sleep better and also help stimulate his appetite and improve depression.  Meanwhile I will obtain lab work to rule out other underlying causes including a CBC to rule out anemia or evidence of bone marrow abnormalities, CMP to evaluate for any electrolyte abnormalities, a TSH to evaluate for thyroid issues, and a sed rate to evaluate for any underlying autoimmune diseases or malignancy.  If labs are normal and echocardiogram is normal, I would recommend continuing the medication for depression and then reassessing in 3 to 4 weeks.  09/25/19 Patient is here today for complete physical exam.  He is doing better since I last saw him.  He discontinued Remeron.  He states that the medication gave him vivid dreams that he did not tolerate.  However he is doing better.  He denies any shortness of breath the day.  He believes that his shortness of breath was primarily due to the humidity in the heat and now that the weather has changed, he feels like his breathing is better.  His only complaint  today is pain and stiffness in his neck.  He reports crepitus with range of motion in lateral rotation.  He also reports crepitus and pain with forward flexion.  He is not taking any anti-inflammatory.  He denies any cervical radiculopathy.  He is here today for complete physical exam.  He denies any issues with depression.  He denies any issues with falls.  He denies any issues with memory loss.  Due to his age, prostate cancer screening is not recommended.  Colon cancer screening is also not recommended.  We discussed this in detail and the patient declines prostate cancer screening or colonoscopy.  He denies any issues with his stomach or melena or hematochezia.  He denies any dysuria or urgency or frequency or lower  urinary tract symptoms.  His immunizations as shown below are up-to-date Immunization History  Administered Date(s) Administered  . Fluad Quad(high Dose 65+) 08/30/2019  . Influenza, High Dose Seasonal PF 09/13/2018  . Influenza,inj,Quad PF,6+ Mos 09/05/2013, 09/03/2014, 09/07/2016, 09/02/2017  . Pneumococcal Conjugate-13 11/12/2013  . Pneumococcal Polysaccharide-23 01/10/2006  . Tdap 08/11/2011  . Zoster 11/23/2007  . Zoster Recombinat (Shingrix) 12/22/2017, 03/23/2018    Past Medical History:  Diagnosis Date  . COPD (chronic obstructive pulmonary disease) ([place])   . GERD (gastroesophageal reflux disease)    w/ hiatal hernia  . H/O sarcoidosis   . History of BPH   . History of Helicobacter pylori infection   . MVP (mitral valve prolapse)    MV regerg  . Personal history of other malignant neoplasm of skin   . Tubular adenoma of colon    Past Surgical History:  Procedure Laterality Date  . APPENDECTOMY    . HERNIA REPAIR    . TONSILLECTOMY     No current outpatient medications on file prior to visit.   No current facility-administered medications on file prior to visit.    Allergies  Allergen Reactions  . Celebrex [Celecoxib] Nausea And Vomiting  . Codeine   . Prevacid [Lansoprazole]    Social History   Socioeconomic History  . Marital status: Married    Spouse name: Not on file  . Number of children: Not on file  . Years of education: Not on file  . Highest education level: Not on file  Occupational History  . Not on file  Social Needs  . Financial resource strain: Not on file  . Food insecurity    Worry: Not on file    Inability: Not on file  . Transportation needs    Medical: Not on file    Non-medical: Not on file  Tobacco Use  . Smoking status: Never Smoker  . Smokeless tobacco: Never Used  Substance and Sexual Activity  . Alcohol use: No  . Drug use: No  . Sexual activity: Not Currently  Lifestyle  . Physical activity    Days per week: Not on  file    Minutes per session: Not on file  . Stress: Not on file  Relationships  . Social Herbalist on phone: Not on file    Gets together: Not on file    Attends religious service: Not on file    Active member of club or organization: Not on file    Attends meetings of clubs or organizations: Not on file    Relationship status: Not on file  . Intimate partner violence    Fear of current or ex partner: Not on file    Emotionally abused: Not on  file    Physically abused: Not on file    Forced sexual activity: Not on file  Other Topics Concern  . Not on file  Social History Narrative  . Not on file   Family History  Problem Relation Age of Onset  . Cancer Mother        ovarian  . Cancer Father        pancreatic    Family History  Problem Relation Age of Onset  . Cancer Mother        ovarian  . Cancer Father        pancreatic      Review of Systems  All other systems reviewed and are negative.      Objective:   Physical Exam  Constitutional: He is oriented to person, place, and time. He appears well-developed and well-nourished. No distress.  HENT:  Head: Normocephalic and atraumatic.  Right Ear: External ear normal.  Left Ear: External ear normal.  Nose: Nose normal.  Mouth/Throat: Oropharynx is clear and moist. No oropharyngeal exudate.  Eyes: Pupils are equal, round, and reactive to light. Conjunctivae and EOM are normal. Right eye exhibits no discharge. Left eye exhibits no discharge. No scleral icterus.  Neck: Normal range of motion. Neck supple. No JVD present. No tracheal deviation present. No thyromegaly present.  Cardiovascular: Normal rate, regular rhythm, normal heart sounds and intact distal pulses. Exam reveals no gallop and no friction rub.  No murmur heard. Pulmonary/Chest: Effort normal and breath sounds normal. No stridor. No respiratory distress. He has no wheezes. He has no rales. He exhibits no tenderness.  Abdominal: Soft. Bowel  sounds are normal. He exhibits no distension and no mass. There is no abdominal tenderness. There is no rebound and no guarding.  Musculoskeletal: Normal range of motion.        General: No tenderness or edema.  Lymphadenopathy:    He has no cervical adenopathy.  Neurological: He is alert and oriented to person, place, and time. He has normal reflexes. No cranial nerve deficit. He exhibits normal muscle tone. Coordination normal.  Skin: Skin is warm. No rash noted. He is not diaphoretic. No erythema. No pallor.  Psychiatric: He has a normal mood and affect. His behavior is normal. Judgment and thought content normal.  Vitals reviewed.         Assessment & Plan:  SOB (shortness of breath) - Plan: DG Chest 2 View  Neck pain - Plan: DG Cervical Spine Complete, meloxicam (MOBIC) 15 MG tablet  Routine general medical examination at a health care facility  Given his previous history of shortness of breath along with his history of sarcoidosis, I have recommended a chest x-ray.  I have scheduled that.  The patient simply needs to go get the x-ray however at the present time he feels so much better he does not feel that he needs it.  I would leave this up to his discretion.  Given his neck pain I recommended an x-ray of the cervical spine.  I anticipate that the patient has arthritis.  Meanwhile he can take meloxicam 15 mg p.o. daily as needed arthritic pain.  We discussed the risk of GI toxicity and renal toxicity due to prolonged and repeated use of NSAIDs.  I encouraged him to use this medication sparingly.  I have recommended against a colonoscopy or prostate cancer screening.  Patient's most recent lab work was outstanding.  I see no reason to repeat that less than a month later.  Regular anticipatory guidance is provided.  Immunizations are up-to-date.  His physical exam is normal.

## 2019-10-27 ENCOUNTER — Other Ambulatory Visit: Payer: Self-pay | Admitting: Family Medicine

## 2019-10-27 DIAGNOSIS — M542 Cervicalgia: Secondary | ICD-10-CM

## 2019-11-05 ENCOUNTER — Other Ambulatory Visit: Payer: Self-pay

## 2019-11-05 ENCOUNTER — Ambulatory Visit
Admission: RE | Admit: 2019-11-05 | Discharge: 2019-11-05 | Disposition: A | Discharge status: Home / Self Care | Payer: Medicare Other | Source: Ambulatory Visit | Attending: Family Medicine | Admitting: Family Medicine

## 2019-11-05 DIAGNOSIS — R0602 Shortness of breath: Secondary | ICD-10-CM

## 2020-02-05 DIAGNOSIS — H903 Sensorineural hearing loss, bilateral: Secondary | ICD-10-CM | POA: Diagnosis not present

## 2020-02-19 DIAGNOSIS — L821 Other seborrheic keratosis: Secondary | ICD-10-CM | POA: Diagnosis not present

## 2020-02-19 DIAGNOSIS — C44519 Basal cell carcinoma of skin of other part of trunk: Secondary | ICD-10-CM | POA: Diagnosis not present

## 2020-02-19 DIAGNOSIS — L570 Actinic keratosis: Secondary | ICD-10-CM | POA: Diagnosis not present

## 2020-02-19 DIAGNOSIS — C4441 Basal cell carcinoma of skin of scalp and neck: Secondary | ICD-10-CM | POA: Diagnosis not present

## 2020-02-19 DIAGNOSIS — D485 Neoplasm of uncertain behavior of skin: Secondary | ICD-10-CM | POA: Diagnosis not present

## 2020-02-19 DIAGNOSIS — Z85828 Personal history of other malignant neoplasm of skin: Secondary | ICD-10-CM | POA: Diagnosis not present

## 2020-02-27 DIAGNOSIS — Z85828 Personal history of other malignant neoplasm of skin: Secondary | ICD-10-CM | POA: Diagnosis not present

## 2020-02-27 DIAGNOSIS — C4441 Basal cell carcinoma of skin of scalp and neck: Secondary | ICD-10-CM | POA: Diagnosis not present

## 2020-02-27 DIAGNOSIS — C44519 Basal cell carcinoma of skin of other part of trunk: Secondary | ICD-10-CM | POA: Diagnosis not present

## 2020-07-15 ENCOUNTER — Other Ambulatory Visit: Payer: Self-pay

## 2020-07-15 ENCOUNTER — Ambulatory Visit (INDEPENDENT_AMBULATORY_CARE_PROVIDER_SITE_OTHER): Payer: Medicare PPO | Admitting: Family Medicine

## 2020-07-15 VITALS — BP 120/60 | HR 84 | Temp 97.7°F | Ht 74.0 in | Wt 141.0 lb

## 2020-07-15 DIAGNOSIS — M79605 Pain in left leg: Secondary | ICD-10-CM | POA: Diagnosis not present

## 2020-07-15 DIAGNOSIS — M79604 Pain in right leg: Secondary | ICD-10-CM

## 2020-07-15 NOTE — Progress Notes (Signed)
Subjective:    Patient ID: Bradley Beasley, male    DOB: 1933-09-27, 86 y.o.   MRN: [id number]  HPI  Wt Readings from Last 3 Encounters:  07/15/20 141 lb (64 kg)  09/25/19 149 lb (67.6 kg)  07/31/19 144 lb (65.3 kg)   Patient is a very pleasant 86 year old Caucasian male here today for a general checkup.  He complains of bilateral leg pain.  The pain is primarily in his quadriceps and in his hamstrings.  He states that every morning his legs ache and throb.  They get better the more he walks on him suggesting against peripheral vascular disease.  He states that this happens every summer per tickly when he gets really humid.  He admits that he works in the garden on a daily basis and often he gets so hot that he will become weak and feel lightheaded.  He states that he is not drinking enough water.  He denies any numbness in his legs.  He denies any tingling in his legs.  He denies any burning or stinging in his legs.  Reflexes today are normal.  He has 2/4 dorsalis pedis and posterior tibialis pulses bilaterally.  Muscle strength is 5/5 equal and symmetric  Past Medical History:  Diagnosis Date  . COPD (chronic obstructive pulmonary disease) ([place])   . GERD (gastroesophageal reflux disease)    w/ hiatal hernia  . H/O sarcoidosis   . History of BPH   . History of Helicobacter pylori infection   . MVP (mitral valve prolapse)    MV regerg  . Personal history of other malignant neoplasm of skin   . Tubular adenoma of colon    Past Surgical History:  Procedure Laterality Date  . APPENDECTOMY    . HERNIA REPAIR    . TONSILLECTOMY     Current Outpatient Medications on File Prior to Visit  Medication Sig Dispense Refill  . meloxicam (MOBIC) 15 MG tablet TAKE 1 TABLET BY MOUTH EVERY DAY 30 tablet 0   No current facility-administered medications on file prior to visit.   Allergies  Allergen Reactions  . Celebrex [Celecoxib] Nausea And Vomiting  . Codeine   . Prevacid [Lansoprazole]     Social History   Socioeconomic History  . Marital status: Married    Spouse name: Not on file  . Number of children: Not on file  . Years of education: Not on file  . Highest education level: Not on file  Occupational History  . Not on file  Tobacco Use  . Smoking status: Never Smoker  . Smokeless tobacco: Never Used  Substance and Sexual Activity  . Alcohol use: No  . Drug use: No  . Sexual activity: Not Currently  Other Topics Concern  . Not on file  Social History Narrative  . Not on file   Social Determinants of Health   Financial Resource Strain:   . Difficulty of Paying Living Expenses:   Food Insecurity:   . Worried About Charity fundraiser in the Last Year:   . Arboriculturist in the Last Year:   Transportation Needs:   . Film/video editor (Medical):   Marland Kitchen Lack of Transportation (Non-Medical):   Physical Activity:   . Days of Exercise per Week:   . Minutes of Exercise per Session:   Stress:   . Feeling of Stress :   Social Connections:   . Frequency of Communication with Friends and Family:   . Frequency of  Social Gatherings with Friends and Family:   . Attends Religious Services:   . Active Member of Clubs or Organizations:   . Attends Archivist Meetings:   Marland Kitchen Marital Status:   Intimate Partner Violence:   . Fear of Current or Ex-Partner:   . Emotionally Abused:   Marland Kitchen Physically Abused:   . Sexually Abused:    Family History  Problem Relation Age of Onset  . Cancer Mother        ovarian  . Cancer Father        pancreatic    Family History  Problem Relation Age of Onset  . Cancer Mother        ovarian  . Cancer Father        pancreatic      Review of Systems  All other systems reviewed and are negative.      Objective:   Physical Exam Vitals reviewed.  Constitutional:      General: He is not in acute distress.    Appearance: He is well-developed. He is not diaphoretic.  HENT:     Head: Normocephalic and  atraumatic.     Right Ear: External ear normal.     Left Ear: External ear normal.     Nose: Nose normal.     Mouth/Throat:     Pharynx: No oropharyngeal exudate.  Eyes:     General: No scleral icterus.       Right eye: No discharge.        Left eye: No discharge.     Conjunctiva/sclera: Conjunctivae normal.     Pupils: Pupils are equal, round, and reactive to light.  Neck:     Thyroid: No thyromegaly.     Vascular: No JVD.     Trachea: No tracheal deviation.  Cardiovascular:     Rate and Rhythm: Normal rate and regular rhythm.     Heart sounds: Normal heart sounds. No murmur heard.  No friction rub. No gallop.   Pulmonary:     Effort: Pulmonary effort is normal. No respiratory distress.     Breath sounds: Normal breath sounds. No stridor. No wheezing or rales.  Chest:     Chest wall: No tenderness.  Abdominal:     General: Bowel sounds are normal. There is no distension.     Palpations: Abdomen is soft. There is no mass.     Tenderness: There is no abdominal tenderness. There is no guarding or rebound.  Musculoskeletal:        General: No tenderness. Normal range of motion.     Cervical back: Normal range of motion and neck supple.  Lymphadenopathy:     Cervical: No cervical adenopathy.  Skin:    General: Skin is warm.     Coloration: Skin is not pale.     Findings: No erythema or rash.  Neurological:     Mental Status: He is alert and oriented to person, place, and time.     Cranial Nerves: No cranial nerve deficit.     Motor: No abnormal muscle tone.     Coordination: Coordination normal.     Deep Tendon Reflexes: Reflexes are normal and symmetric.  Psychiatric:        Behavior: Behavior normal.        Thought Content: Thought content normal.        Judgment: Judgment normal.           Assessment & Plan:  Leg pain, bilateral - Plan: CBC  with Differential/Platelet, COMPLETE METABOLIC PANEL WITH GFR, Sedimentation rate, CK  I suspect a combination of age,  with lactic acidosis due to physical activity, and dehydration.  I will check a sedimentation rate to evaluate for possible signs of PMR.  I will check a CK level to evaluate for myositis.  I will check a CMP to evaluate for any evidence of renal insufficiency and a CBC to evaluate for any bone marrow abnormalities.  If labs are normal, I encouraged the patient to work early in the morning when it is cooler.  Take frequent breaks.  Try to drink plenty of water.  And he uses meloxicam more consistently.  He is not using it hardly at all now.  I warned him against using it every day due to potential GI and renal toxicity.  However I think if he takes it 3 or 4 days and then skips it and takes a break for a few days this should prevent any serious issues and help his leg pain

## 2020-07-16 LAB — CBC WITH DIFFERENTIAL/PLATELET
Absolute Monocytes: 527 cells/uL (ref 200–950)
Basophils Absolute: 41 cells/uL (ref 0–200)
Basophils Relative: 0.9 %
Eosinophils Absolute: 194 cells/uL (ref 15–500)
Eosinophils Relative: 4.3 %
HCT: 48.9 % (ref 38.5–50.0)
Hemoglobin: 16 g/dL (ref 13.2–17.1)
Lymphs Abs: 1220 cells/uL (ref 850–3900)
MCH: 31.9 pg (ref 27.0–33.0)
MCHC: 32.7 g/dL (ref 32.0–36.0)
MCV: 97.6 fL (ref 80.0–100.0)
MPV: 11.4 fL (ref 7.5–12.5)
Monocytes Relative: 11.7 %
Neutro Abs: 2520 cells/uL (ref 1500–7800)
Neutrophils Relative %: 56 %
Platelets: 154 10*3/uL (ref 140–400)
RBC: 5.01 10*6/uL (ref 4.20–5.80)
RDW: 12.6 % (ref 11.0–15.0)
Total Lymphocyte: 27.1 %
WBC: 4.5 10*3/uL (ref 3.8–10.8)

## 2020-07-16 LAB — COMPLETE METABOLIC PANEL WITH GFR
AG Ratio: 1.7 (calc) (ref 1.0–2.5)
ALT: 15 U/L (ref 9–46)
AST: 22 U/L (ref 10–35)
Albumin: 4.6 g/dL (ref 3.6–5.1)
Alkaline phosphatase (APISO): 64 U/L (ref 35–144)
BUN: 16 mg/dL (ref 7–25)
CO2: 28 mmol/L (ref 20–32)
Calcium: 9.7 mg/dL (ref 8.6–10.3)
Chloride: 103 mmol/L (ref 98–110)
Creat: 0.99 mg/dL (ref 0.70–1.11)
GFR, Est African American: 81 mL/min/{1.73_m2} (ref >=60)
GFR, Est Non African American: 70 mL/min/{1.73_m2} (ref >=60)
Globulin: 2.7 g/dL (calc) (ref 1.9–3.7)
Glucose, Bld: 98 mg/dL (ref 65–99)
Potassium: 4.2 mmol/L (ref 3.5–5.3)
Sodium: 141 mmol/L (ref 135–146)
Total Bilirubin: 1.4 mg/dL — ABNORMAL HIGH (ref 0.2–1.2)
Total Protein: 7.3 g/dL (ref 6.1–8.1)

## 2020-07-16 LAB — SEDIMENTATION RATE: Sed Rate: 2 mm/h (ref 0–20)

## 2020-07-16 LAB — CK: Total CK: 80 U/L (ref 44–196)

## 2020-08-12 DIAGNOSIS — H26493 Other secondary cataract, bilateral: Secondary | ICD-10-CM | POA: Diagnosis not present

## 2020-08-12 DIAGNOSIS — H52203 Unspecified astigmatism, bilateral: Secondary | ICD-10-CM | POA: Diagnosis not present

## 2020-08-19 DIAGNOSIS — Z85828 Personal history of other malignant neoplasm of skin: Secondary | ICD-10-CM | POA: Diagnosis not present

## 2020-08-19 DIAGNOSIS — L821 Other seborrheic keratosis: Secondary | ICD-10-CM | POA: Diagnosis not present

## 2020-08-19 DIAGNOSIS — L570 Actinic keratosis: Secondary | ICD-10-CM | POA: Diagnosis not present

## 2020-08-22 ENCOUNTER — Other Ambulatory Visit: Payer: Self-pay

## 2020-08-22 ENCOUNTER — Ambulatory Visit (INDEPENDENT_AMBULATORY_CARE_PROVIDER_SITE_OTHER): Payer: Medicare PPO

## 2020-08-22 DIAGNOSIS — Z23 Encounter for immunization: Secondary | ICD-10-CM | POA: Diagnosis not present

## 2020-09-29 ENCOUNTER — Other Ambulatory Visit: Payer: Self-pay

## 2020-09-29 ENCOUNTER — Ambulatory Visit (INDEPENDENT_AMBULATORY_CARE_PROVIDER_SITE_OTHER): Payer: Medicare PPO | Admitting: Family Medicine

## 2020-09-29 VITALS — BP 120/70 | HR 89 | Temp 97.4°F | Ht 75.0 in | Wt 144.0 lb

## 2020-09-29 DIAGNOSIS — R5382 Chronic fatigue, unspecified: Secondary | ICD-10-CM

## 2020-09-29 DIAGNOSIS — Z0000 Encounter for general adult medical examination without abnormal findings: Secondary | ICD-10-CM

## 2020-09-29 DIAGNOSIS — Z0001 Encounter for general adult medical examination with abnormal findings: Secondary | ICD-10-CM

## 2020-09-29 DIAGNOSIS — R6889 Other general symptoms and signs: Secondary | ICD-10-CM | POA: Diagnosis not present

## 2020-09-29 NOTE — Progress Notes (Signed)
Subjective:    Patient ID: Bradley Beasley, male    DOB: [birth year], 86 y.o.   MRN: [id number]  HPI Patient is here today for a complete physical exam. He denies any falls. He denies any depression. He denies any memory loss. He is still very active around his home. He works every day in his garden. This summer he was complaining of bilateral leg pain which I felt was due to a combination of arthritis as well as dehydration and physical exertion. He states that the leg pain has improved since the weather has changed and is cooler outside leading me to believe that some of his symptoms were due to heat exhaustion and dehydration. However he reports severe fatigue. He states that he just does not have any energy. He becomes fatigued extremely easy. He denies any chest pain or shortness of breath or dyspnea on exertion. Instead he states that he just does not have any energy. For instance, he went to play golf last week and was only able to play about three holes before he had to stop and take a break due to feeling tired. He also reports decreasing muscle mass. His weight is down about 5 pounds from last year although it has improved from the summer when I think he was dehydrated. He denies any angina. He denies any rashes or leg swelling or joint swelling or joint erythema. Past Medical History:  Diagnosis Date  . COPD (chronic obstructive pulmonary disease) ([place])   . GERD (gastroesophageal reflux disease)    w/ hiatal hernia  . H/O sarcoidosis   . History of BPH   . History of Helicobacter pylori infection   . MVP (mitral valve prolapse)    MV regerg  . Personal history of other malignant neoplasm of skin   . Tubular adenoma of colon    Past Surgical History:  Procedure Laterality Date  . APPENDECTOMY    . HERNIA REPAIR    . TONSILLECTOMY     Current Outpatient Medications on File Prior to Visit  Medication Sig Dispense Refill  . meloxicam (MOBIC) 15 MG tablet TAKE 1 TABLET BY MOUTH EVERY  DAY (Patient not taking: Reported on 09/29/2020) 30 tablet 0   No current facility-administered medications on file prior to visit.   Allergies  Allergen Reactions  . Celebrex [Celecoxib] Nausea And Vomiting  . Codeine   . Prevacid [Lansoprazole]    Social History   Socioeconomic History  . Marital status: Married    Spouse name: Not on file  . Number of children: Not on file  . Years of education: Not on file  . Highest education level: Not on file  Occupational History  . Not on file  Tobacco Use  . Smoking status: Never Smoker  . Smokeless tobacco: Never Used  Substance and Sexual Activity  . Alcohol use: No  . Drug use: No  . Sexual activity: Not Currently  Other Topics Concern  . Not on file  Social History Narrative  . Not on file   Social Determinants of Health   Financial Resource Strain:   . Difficulty of Paying Living Expenses: Not on file  Food Insecurity:   . Worried About Charity fundraiser in the Last Year: Not on file  . Ran Out of Food in the Last Year: Not on file  Transportation Needs:   . Lack of Transportation (Medical): Not on file  . Lack of Transportation (Non-Medical): Not on file  Physical Activity:   .  Days of Exercise per Week: Not on file  . Minutes of Exercise per Session: Not on file  Stress:   . Feeling of Stress : Not on file  Social Connections:   . Frequency of Communication with Friends and Family: Not on file  . Frequency of Social Gatherings with Friends and Family: Not on file  . Attends Religious Services: Not on file  . Active Member of Clubs or Organizations: Not on file  . Attends Archivist Meetings: Not on file  . Marital Status: Not on file  Intimate Partner Violence:   . Fear of Current or Ex-Partner: Not on file  . Emotionally Abused: Not on file  . Physically Abused: Not on file  . Sexually Abused: Not on file   Family History  Problem Relation Age of Onset  . Cancer Mother        ovarian  .  Cancer Father        pancreatic       Review of Systems  All other systems reviewed and are negative.      Objective:   Physical Exam  Constitutional: He is oriented to person, place, and time. He appears well-developed and well-nourished. No distress.  HENT:  Head: Normocephalic and atraumatic.  Right Ear: External ear normal.  Left Ear: External ear normal.  Nose: Nose normal.  Mouth/Throat: Oropharynx is clear and moist. No oropharyngeal exudate.  Eyes: Conjunctivae and EOM are normal. Pupils are equal, round, and reactive to light. Right eye exhibits no discharge. Left eye exhibits no discharge. No scleral icterus.  Neck: Normal range of motion. Neck supple. No JVD present. No tracheal deviation present. No thyromegaly present.  Cardiovascular: Normal rate, regular rhythm, normal heart sounds and intact distal pulses.  Exam reveals no gallop and no friction rub.   No murmur heard. Pulmonary/Chest: Effort normal and breath sounds normal. No stridor. No respiratory distress. He has no wheezes. He has no rales. He exhibits no tenderness.  Abdominal: Soft. Bowel sounds are normal. He exhibits no distension and no mass. There is no tenderness. There is no rebound and no guarding.  Musculoskeletal: Normal range of motion. He exhibits no edema or tenderness.  Lymphadenopathy:    He has no cervical adenopathy.  Neurological: He is alert and oriented to person, place, and time. He has normal reflexes. He displays normal reflexes. No cranial nerve deficit. He exhibits normal muscle tone. Coordination normal.  Skin: Skin is warm. No rash noted. He is not diaphoretic. No erythema. No pallor.  Psychiatric: He has a normal mood and affect. His behavior is normal. Judgment and thought content normal.  Vitals reviewed.         Assessment & Plan:   Chronic fatigue - Plan: Testosterone Total,Free,Bio, Males, Vitamin B12, TSH, CBC with Differential/Platelet, COMPLETE METABOLIC PANEL WITH  GFR  Routine general medical examination at a health care facility  Patient denies any falls, depression, or memory loss. Given his fatigue and muscle weakness I will check a testosterone level, B12, and a TSH. I will also repeat a CBC and a CMP. This summer I checked a sed rate and a CK level which were both normal. I suspect some of this is due to age and protein calorie malnutrition. However I do want to rule out underlying hormonal or vitamin deficiencies that may be contributing. Do not recommend a colonoscopy given his age. Would also not recommend prostate cancer screening without symptoms due to age. Immunizations are up-to-date. Patient is  already had his Covid booster, his flu shot, and both pneumonia vaccines. Await the results of his lab work. He is already taking a protein supplement such as Ensure or every day.

## 2020-09-30 LAB — COMPLETE METABOLIC PANEL WITH GFR
AG Ratio: 1.9 (calc) (ref 1.0–2.5)
ALT: 16 U/L (ref 9–46)
AST: 24 U/L (ref 10–35)
Albumin: 4.6 g/dL (ref 3.6–5.1)
Alkaline phosphatase (APISO): 59 U/L (ref 35–144)
BUN: 17 mg/dL (ref 7–25)
CO2: 28 mmol/L (ref 20–32)
Calcium: 9.8 mg/dL (ref 8.6–10.3)
Chloride: 101 mmol/L (ref 98–110)
Creat: 0.9 mg/dL (ref 0.70–1.11)
GFR, Est African American: 91 mL/min/{1.73_m2} (ref >=60)
GFR, Est Non African American: 78 mL/min/{1.73_m2} (ref >=60)
Globulin: 2.4 g/dL (calc) (ref 1.9–3.7)
Glucose, Bld: 107 mg/dL — ABNORMAL HIGH (ref 65–99)
Potassium: 4.5 mmol/L (ref 3.5–5.3)
Sodium: 139 mmol/L (ref 135–146)
Total Bilirubin: 0.9 mg/dL (ref 0.2–1.2)
Total Protein: 7 g/dL (ref 6.1–8.1)

## 2020-09-30 LAB — CBC WITH DIFFERENTIAL/PLATELET
Absolute Monocytes: 456 cells/uL (ref 200–950)
Basophils Absolute: 31 cells/uL (ref 0–200)
Basophils Relative: 0.8 %
Eosinophils Absolute: 160 cells/uL (ref 15–500)
Eosinophils Relative: 4.1 %
HCT: 47.6 % (ref 38.5–50.0)
Hemoglobin: 15.4 g/dL (ref 13.2–17.1)
Lymphs Abs: 1049 cells/uL (ref 850–3900)
MCH: 31.1 pg (ref 27.0–33.0)
MCHC: 32.4 g/dL (ref 32.0–36.0)
MCV: 96.2 fL (ref 80.0–100.0)
MPV: 11.1 fL (ref 7.5–12.5)
Monocytes Relative: 11.7 %
Neutro Abs: 2204 cells/uL (ref 1500–7800)
Neutrophils Relative %: 56.5 %
Platelets: 174 10*3/uL (ref 140–400)
RBC: 4.95 10*6/uL (ref 4.20–5.80)
RDW: 12.7 % (ref 11.0–15.0)
Total Lymphocyte: 26.9 %
WBC: 3.9 10*3/uL (ref 3.8–10.8)

## 2020-09-30 LAB — TSH: TSH: 3.17 mIU/L (ref 0.40–4.50)

## 2020-09-30 LAB — TESTOSTERONE TOTAL,FREE,BIO, MALES
Albumin: 4.6 g/dL (ref 3.6–5.1)
Sex Hormone Binding: 90 nmol/L — ABNORMAL HIGH (ref 22–77)
Testosterone, Bioavailable: 72.7 ng/dL (ref 15.0–150.0)
Testosterone, Free: 34.6 pg/mL (ref 6.0–73.0)
Testosterone: 629 ng/dL (ref 250–827)

## 2020-09-30 LAB — VITAMIN B12: Vitamin B-12: 321 pg/mL (ref 200–1100)

## 2021-02-16 DIAGNOSIS — C44612 Basal cell carcinoma of skin of right upper limb, including shoulder: Secondary | ICD-10-CM | POA: Diagnosis not present

## 2021-02-16 DIAGNOSIS — L570 Actinic keratosis: Secondary | ICD-10-CM | POA: Diagnosis not present

## 2021-02-16 DIAGNOSIS — L821 Other seborrheic keratosis: Secondary | ICD-10-CM | POA: Diagnosis not present

## 2021-02-16 DIAGNOSIS — L820 Inflamed seborrheic keratosis: Secondary | ICD-10-CM | POA: Diagnosis not present

## 2021-02-16 DIAGNOSIS — Z85828 Personal history of other malignant neoplasm of skin: Secondary | ICD-10-CM | POA: Diagnosis not present

## 2021-02-16 DIAGNOSIS — D485 Neoplasm of uncertain behavior of skin: Secondary | ICD-10-CM | POA: Diagnosis not present

## 2021-07-02 ENCOUNTER — Other Ambulatory Visit: Payer: Self-pay

## 2021-07-02 ENCOUNTER — Ambulatory Visit: Payer: Medicare PPO | Admitting: Family Medicine

## 2021-07-02 ENCOUNTER — Encounter: Payer: Self-pay | Admitting: Family Medicine

## 2021-07-02 VITALS — BP 122/64 | HR 68 | Temp 98.4°F | Resp 16 | Ht 75.0 in | Wt 142.0 lb

## 2021-07-02 DIAGNOSIS — M791 Myalgia, unspecified site: Secondary | ICD-10-CM

## 2021-07-02 DIAGNOSIS — M79605 Pain in left leg: Secondary | ICD-10-CM

## 2021-07-02 DIAGNOSIS — G9519 Other vascular myelopathies: Secondary | ICD-10-CM | POA: Diagnosis not present

## 2021-07-02 DIAGNOSIS — M79604 Pain in right leg: Secondary | ICD-10-CM

## 2021-07-02 DIAGNOSIS — R29818 Other symptoms and signs involving the nervous system: Secondary | ICD-10-CM

## 2021-07-02 NOTE — Progress Notes (Signed)
Subjective:    Patient ID: Bradley Beasley, male    DOB: [birth year], 87 y.o.   MRN: [id number]  HPI  07/2020 Wt Readings from Last 3 Encounters:  07/02/21 142 lb (64.4 kg)  09/29/20 144 lb (65.3 kg)  07/15/20 141 lb (64 kg)   Patient is a very pleasant 87 year old Caucasian male here today for a general checkup.  He complains of bilateral leg pain.  The pain is primarily in his quadriceps and in his hamstrings.  He states that every morning his legs ache and throb.  They get better the more he walks on him suggesting against peripheral vascular disease.  He states that this happens every summer per tickly when he gets really humid.  He admits that he works in the garden on a daily basis and often he gets so hot that he will become weak and feel lightheaded.  He states that he is not drinking enough water.  He denies any numbness in his legs.  He denies any tingling in his legs.  He denies any burning or stinging in his legs.  Reflexes today are normal.  He has 2/4 dorsalis pedis and posterior tibialis pulses bilaterally.  Muscle strength is 5/5 equal and symmetric.  At that time, my plan was: I suspect a combination of age, with lactic acidosis due to physical activity, and dehydration.  I will check a sedimentation rate to evaluate for possible signs of PMR.  I will check a CK level to evaluate for myositis.  I will check a CMP to evaluate for any evidence of renal insufficiency and a CBC to evaluate for any bone marrow abnormalities.  If labs are normal, I encouraged the patient to work early in the morning when it is cooler.  Take frequent breaks.  Try to drink plenty of water.  And he uses meloxicam more consistently.  He is not using it hardly at all now.  I warned him against using it every day due to potential GI and renal toxicity.  However I think if he takes it 3 or 4 days and then skips it and takes a break for a few days this should prevent any serious issues and help his leg  pain  07/02/21 Patient is a very pleasant 87 year old Caucasian gentleman who is here today complaining again of leg pain.  The pain is primarily in both hamstrings.  He states that after he stands for a long period of time, his legs begin to feel weak.  He has to sit down or lay down.  If he sits down or lays down for several minutes, the pain improves.  His pain is like a tooth ache.  It tends to situate just in the hamstrings although he is also having some numbness and tingling in his toes on both feet.  He denies any claudication.  He has excellent dorsalis pedis, posterior tibialis, popliteal, and femoral pulses bilaterally.  He has normal reflexes in both legs and muscle strength is 5/5 equal and symmetric in both legs.  He denies any pain in his shoulders or weakness in his shoulders.  Past Medical History:  Diagnosis Date   COPD (chronic obstructive pulmonary disease) (HCC)    GERD (gastroesophageal reflux disease)    w/ hiatal hernia   H/O sarcoidosis    History of BPH    History of Helicobacter pylori infection    MVP (mitral valve prolapse)    MV regerg   Personal history of other malignant neoplasm  of skin    Tubular adenoma of colon    Past Surgical History:  Procedure Laterality Date   APPENDECTOMY     HERNIA REPAIR     TONSILLECTOMY     Current Outpatient Medications on File Prior to Visit  Medication Sig Dispense Refill   Cyanocobalamin (VITAMIN B 12 PO) Take by mouth.     No current facility-administered medications on file prior to visit.   Allergies  Allergen Reactions   Celebrex [Celecoxib] Nausea And Vomiting   Codeine    Prevacid [Lansoprazole]    Social History   Socioeconomic History   Marital status: Married    Spouse name: Not on file   Number of children: Not on file   Years of education: Not on file   Highest education level: Not on file  Occupational History   Not on file  Tobacco Use   Smoking status: Never   Smokeless tobacco: Never   Substance and Sexual Activity   Alcohol use: No   Drug use: No   Sexual activity: Not Currently  Other Topics Concern   Not on file  Social History Narrative   Not on file   Social Determinants of Health   Financial Resource Strain: Not on file  Food Insecurity: Not on file  Transportation Needs: Not on file  Physical Activity: Not on file  Stress: Not on file  Social Connections: Not on file  Intimate Partner Violence: Not on file   Family History  Problem Relation Age of Onset   Cancer Mother        ovarian   Cancer Father        pancreatic    Family History  Problem Relation Age of Onset   Cancer Mother        ovarian   Cancer Father        pancreatic      Review of Systems     Objective:   Physical Exam Vitals reviewed.  Constitutional:      Appearance: Normal appearance. He is normal weight.  Cardiovascular:     Rate and Rhythm: Normal rate and regular rhythm.     Pulses: Normal pulses.     Heart sounds: Normal heart sounds. No murmur heard.   No friction rub. No gallop.  Pulmonary:     Effort: Pulmonary effort is normal.     Breath sounds: Normal breath sounds.  Musculoskeletal:        General: No deformity.     Right lower leg: No edema.     Left lower leg: No edema.       Legs:  Skin:    Coloration: Skin is not jaundiced.     Findings: No bruising or erythema.  Neurological:     General: No focal deficit present.     Mental Status: He is alert and oriented to person, place, and time.     Cranial Nerves: No cranial nerve deficit.     Motor: No weakness.     Coordination: Coordination normal.     Gait: Gait normal.          Assessment & Plan:  Myalgia - Plan: CK, CBC with Differential/Platelet, COMPLETE METABOLIC PANEL WITH GFR, Sedimentation rate, TSH, DG Lumbar Spine Complete  Leg pain, bilateral - Plan: Cyanocobalamin (VITAMIN B 12 PO), CK, CBC with Differential/Platelet, COMPLETE METABOLIC PANEL WITH GFR, Sedimentation rate,  TSH, DG Lumbar Spine Complete  Neurogenic claudication (HCC) - Plan: Cyanocobalamin (VITAMIN B 12 PO), CK, CBC  with Differential/Platelet, COMPLETE METABOLIC PANEL WITH GFR, Sedimentation rate, TSH, DG Lumbar Spine Complete Patient states that this has been gradually going on the last 2 to 3 years and has been getting steadily worse.  He states it is primarily due to standing for a long period of time working in his garden or when he stands and walks for a long period of time.  Therefore I believe the bilateral leg pain is most likely neurogenic claudication.  There is no evidence for any peripheral vascular disease today on his exam.  The remainder of his exam is normal.  There is no palpable abnormalities in his hamstrings bilaterally.  I will begin by obtaining an x-ray of his lumbar spine.  Also check CBC CMP sed rate and TSH.  If labs and x-rays are normal, the next step would be either nerve conduction studies versus an MRI.  Await the results of the basic labs first.

## 2021-07-03 LAB — CK: Total CK: 70 U/L (ref 44–196)

## 2021-07-03 LAB — CBC WITH DIFFERENTIAL/PLATELET
Absolute Monocytes: 702 cells/uL (ref 200–950)
Basophils Absolute: 53 cells/uL (ref 0–200)
Basophils Relative: 0.9 %
Eosinophils Absolute: 183 cells/uL (ref 15–500)
Eosinophils Relative: 3.1 %
HCT: 47.4 % (ref 38.5–50.0)
Hemoglobin: 15.8 g/dL (ref 13.2–17.1)
Lymphs Abs: 1835 cells/uL (ref 850–3900)
MCH: 31.8 pg (ref 27.0–33.0)
MCHC: 33.3 g/dL (ref 32.0–36.0)
MCV: 95.4 fL (ref 80.0–100.0)
MPV: 10.9 fL (ref 7.5–12.5)
Monocytes Relative: 11.9 %
Neutro Abs: 3127 cells/uL (ref 1500–7800)
Neutrophils Relative %: 53 %
Platelets: 178 10*3/uL (ref 140–400)
RBC: 4.97 10*6/uL (ref 4.20–5.80)
RDW: 12.7 % (ref 11.0–15.0)
Total Lymphocyte: 31.1 %
WBC: 5.9 10*3/uL (ref 3.8–10.8)

## 2021-07-03 LAB — COMPLETE METABOLIC PANEL WITH GFR
AG Ratio: 1.8 (calc) (ref 1.0–2.5)
ALT: 14 U/L (ref 9–46)
AST: 20 U/L (ref 10–35)
Albumin: 4.6 g/dL (ref 3.6–5.1)
Alkaline phosphatase (APISO): 60 U/L (ref 35–144)
BUN: 17 mg/dL (ref 7–25)
CO2: 29 mmol/L (ref 20–32)
Calcium: 9.7 mg/dL (ref 8.6–10.3)
Chloride: 102 mmol/L (ref 98–110)
Creat: 1.09 mg/dL (ref 0.70–1.22)
Globulin: 2.5 g/dL (calc) (ref 1.9–3.7)
Glucose, Bld: 81 mg/dL (ref 65–99)
Potassium: 4.5 mmol/L (ref 3.5–5.3)
Sodium: 142 mmol/L (ref 135–146)
Total Bilirubin: 0.7 mg/dL (ref 0.2–1.2)
Total Protein: 7.1 g/dL (ref 6.1–8.1)
eGFR: 67 mL/min/{1.73_m2} (ref >=60)

## 2021-07-03 LAB — SEDIMENTATION RATE: Sed Rate: 11 mm/h (ref 0–20)

## 2021-07-03 LAB — TSH: TSH: 3.14 mIU/L (ref 0.40–4.50)

## 2021-07-06 ENCOUNTER — Telehealth: Payer: Self-pay

## 2021-07-06 NOTE — Telephone Encounter (Signed)
Pt's daughter called returning call for pt. Pt wanted to discuss lab results. Pt's daughter asked if  you would please call her.   Cb#: Levada Dy pt's daughter [phone]

## 2021-07-06 NOTE — Telephone Encounter (Signed)
Please see labs for further details.

## 2021-07-07 ENCOUNTER — Other Ambulatory Visit: Payer: Self-pay

## 2021-07-07 ENCOUNTER — Ambulatory Visit
Admission: RE | Admit: 2021-07-07 | Discharge: 2021-07-07 | Disposition: A | Discharge status: Home / Self Care | Payer: Medicare PPO | Source: Ambulatory Visit | Attending: Family Medicine | Admitting: Family Medicine

## 2021-07-07 DIAGNOSIS — M79604 Pain in right leg: Secondary | ICD-10-CM

## 2021-07-07 DIAGNOSIS — M791 Myalgia, unspecified site: Secondary | ICD-10-CM

## 2021-07-07 DIAGNOSIS — G9519 Other vascular myelopathies: Secondary | ICD-10-CM

## 2021-07-07 DIAGNOSIS — M545 Low back pain, unspecified: Secondary | ICD-10-CM | POA: Diagnosis not present

## 2021-07-09 ENCOUNTER — Telehealth: Payer: Self-pay

## 2021-07-09 NOTE — Telephone Encounter (Signed)
Please see results for further information.

## 2021-07-09 NOTE — Telephone Encounter (Signed)
Pt's daughter called in wanting to discuss the results of pt's x-ray. Please call.  Cb#: [phone]

## 2021-07-10 ENCOUNTER — Other Ambulatory Visit: Payer: Self-pay | Admitting: *Deleted

## 2021-07-10 DIAGNOSIS — M48061 Spinal stenosis, lumbar region without neurogenic claudication: Secondary | ICD-10-CM

## 2021-07-10 DIAGNOSIS — M5136 Other intervertebral disc degeneration, lumbar region: Secondary | ICD-10-CM

## 2021-08-01 ENCOUNTER — Ambulatory Visit
Admission: RE | Admit: 2021-08-01 | Discharge: 2021-08-01 | Disposition: A | Discharge status: Home / Self Care | Payer: Medicare PPO | Source: Ambulatory Visit | Attending: Family Medicine | Admitting: Family Medicine

## 2021-08-01 ENCOUNTER — Other Ambulatory Visit: Payer: Self-pay

## 2021-08-01 DIAGNOSIS — M48061 Spinal stenosis, lumbar region without neurogenic claudication: Secondary | ICD-10-CM | POA: Diagnosis not present

## 2021-08-01 DIAGNOSIS — M545 Low back pain, unspecified: Secondary | ICD-10-CM | POA: Diagnosis not present

## 2021-08-01 DIAGNOSIS — M5136 Other intervertebral disc degeneration, lumbar region: Secondary | ICD-10-CM

## 2021-08-05 ENCOUNTER — Telehealth: Payer: Self-pay

## 2021-08-05 NOTE — Telephone Encounter (Signed)
Pt's daughter Eathyn Brees called in wanting to discuss this pt's MRI results. She is not sure if she has permission to discuss PHI, but would like for dr/nurse to give a call back.   Please call Jabriel Higinbotham [phone]

## 2021-08-05 NOTE — Telephone Encounter (Signed)
Please see results for more information.

## 2021-08-06 ENCOUNTER — Other Ambulatory Visit: Payer: Self-pay | Admitting: *Deleted

## 2021-08-06 DIAGNOSIS — M51369 Other intervertebral disc degeneration, lumbar region without mention of lumbar back pain or lower extremity pain: Secondary | ICD-10-CM

## 2021-08-06 DIAGNOSIS — G9519 Other vascular myelopathies: Secondary | ICD-10-CM

## 2021-08-06 DIAGNOSIS — M5126 Other intervertebral disc displacement, lumbar region: Secondary | ICD-10-CM

## 2021-08-06 DIAGNOSIS — M79605 Pain in left leg: Secondary | ICD-10-CM

## 2021-08-06 DIAGNOSIS — M5136 Other intervertebral disc degeneration, lumbar region: Secondary | ICD-10-CM

## 2021-08-06 DIAGNOSIS — R29818 Other symptoms and signs involving the nervous system: Secondary | ICD-10-CM

## 2021-08-06 DIAGNOSIS — M48061 Spinal stenosis, lumbar region without neurogenic claudication: Secondary | ICD-10-CM

## 2021-08-06 DIAGNOSIS — M79604 Pain in right leg: Secondary | ICD-10-CM

## 2021-08-19 DIAGNOSIS — C44629 Squamous cell carcinoma of skin of left upper limb, including shoulder: Secondary | ICD-10-CM | POA: Diagnosis not present

## 2021-08-19 DIAGNOSIS — Z85828 Personal history of other malignant neoplasm of skin: Secondary | ICD-10-CM | POA: Diagnosis not present

## 2021-08-19 DIAGNOSIS — L570 Actinic keratosis: Secondary | ICD-10-CM | POA: Diagnosis not present

## 2021-08-19 DIAGNOSIS — D485 Neoplasm of uncertain behavior of skin: Secondary | ICD-10-CM | POA: Diagnosis not present

## 2021-08-19 DIAGNOSIS — C44719 Basal cell carcinoma of skin of left lower limb, including hip: Secondary | ICD-10-CM | POA: Diagnosis not present

## 2021-08-19 DIAGNOSIS — C44622 Squamous cell carcinoma of skin of right upper limb, including shoulder: Secondary | ICD-10-CM | POA: Diagnosis not present

## 2021-08-19 DIAGNOSIS — L821 Other seborrheic keratosis: Secondary | ICD-10-CM | POA: Diagnosis not present

## 2021-08-19 DIAGNOSIS — D225 Melanocytic nevi of trunk: Secondary | ICD-10-CM | POA: Diagnosis not present

## 2021-08-19 DIAGNOSIS — D692 Other nonthrombocytopenic purpura: Secondary | ICD-10-CM | POA: Diagnosis not present

## 2021-09-08 ENCOUNTER — Other Ambulatory Visit: Payer: Self-pay

## 2021-09-08 ENCOUNTER — Encounter: Payer: Self-pay | Admitting: Orthopaedic Surgery

## 2021-09-08 ENCOUNTER — Ambulatory Visit: Payer: Medicare PPO | Admitting: Orthopaedic Surgery

## 2021-09-08 DIAGNOSIS — M5136 Other intervertebral disc degeneration, lumbar region: Secondary | ICD-10-CM | POA: Diagnosis not present

## 2021-09-08 NOTE — Progress Notes (Signed)
Office Visit Note   Patient: Bradley Beasley           Date of Birth: [birth year]           MRN: [id number] Visit Date: 09/08/2021              Requested by: Susy Frizzle, MD [address] [place],  [place] [address] PCP: Susy Frizzle, MD   Assessment & Plan: Visit Diagnoses:  1. Other intervertebral disc degeneration, lumbar region     Plan: The patient has some mild stenosis at L4-5 with some foraminal narrowing.  No neurogenic claudication symptoms.  He can return if he develops claudication or increasing radicular symptoms.  Pathophysiology discussed MRI scan images and report reviewed.  Nothing needs to be done at this point.  He can continue to use Tylenol or aspirin every once in a while if he has some mild discomfort.  Patient's not really much on taking medication.  I put a call into his daughter Bradley Beasley her phone number [phone] to let her know that no further treatment is indicated and he can continue all his  active gardening and normal activities.  Follow-Up Instructions: Return if symptoms worsen or fail to improve.   Orders:  No orders of the defined types were placed in this encounter.  No orders of the defined types were placed in this encounter.     Procedures: No procedures performed   Clinical Data: No additional findings.   Subjective: Chief Complaint  Patient presents with   Right Leg - Pain   Left Leg - Pain    HPI 87 year old male with problems with back pain particularly aching in his posterior thigh when the weather is hot.  He does better when it is cooler.  He was in the yard yesterday picking up sticks all day raking some.  States he has some neuropathy with some numbness as noted in his feet.  He has had an MRI scan in August that showed some scoliosis multilevel disc degeneration and mild stenosis at 4 5.  He states his wife is done and is good to help and has not been plays much golf due to this.  He does gardening raises  vegetables.  No associated bowel bladder symptoms no chills or fever.  Plain radiograph showed disc space narrowing at L4-5 and L5-S1 with AP showing some asymmetrical disc narrowing consistent with long-term disc degeneration with facet arthropathy.  Review of Systems positive mitral valve prolapse with murmur.  Some prostate hypertrophy history of sarcoidosis.  All other systems noncontributory to HPI.   Objective: Vital Signs: BP 132/82   Ht 6\' 3"  (1.905 m)   Wt 142 lb (64.4 kg)   BMI 17.75 kg/m   Physical Exam Constitutional:      Appearance: He is well-developed.  HENT:     Head: Normocephalic and atraumatic.     Right Ear: External ear normal.     Left Ear: External ear normal.  Eyes:     Pupils: Pupils are equal, round, and reactive to light.  Neck:     Thyroid: No thyromegaly.     Trachea: No tracheal deviation.  Cardiovascular:     Rate and Rhythm: Normal rate.  Pulmonary:     Effort: Pulmonary effort is normal.     Breath sounds: No wheezing.  Abdominal:     General: Bowel sounds are normal.     Palpations: Abdomen is soft.  Musculoskeletal:  Cervical back: Neck supple.  Skin:    General: Skin is warm and dry.     Capillary Refill: Capillary refill takes less than 2 seconds.  Neurological:     Mental Status: He is alert and oriented to person, place, and time.  Psychiatric:        Behavior: Behavior normal.        Thought Content: Thought content normal.        Judgment: Judgment normal.    Ortho Exam normal pedal pulses negative straight leg raising.  Some sciatic notch tenderness some discomfort popliteal compression with straight leg raising 90 degrees.  Knee and ankle jerk are intact negative logroll hips.  Specialty Comments:  No specialty comments available.  Imaging: CLINICAL DATA:  87 year old male with chronic posterior thigh pain with standing long periods. Lumbar spinal stenosis.   EXAM: MRI LUMBAR SPINE WITHOUT CONTRAST    TECHNIQUE: Multiplanar, multisequence MR imaging of the lumbar spine was performed. No intravenous contrast was administered.   COMPARISON:  Lumbar radiographs 07/07/2021. CT Abdomen and Pelvis 08/27/2004.   FINDINGS: Segmentation:  Normal.   Alignment: Mild mostly levoconvex lumbar scoliosis. Mild straightening of lumbar lordosis. No significant spondylolisthesis.   Vertebrae: Multilevel degenerative appearing endplate marrow edema including posteriorly at L2-L3 eccentric to the left, L4-L5 eccentric to the right laterally and L5-S1 eccentric to the left laterally and anteriorly. Background bone marrow signal is within normal limits. Intact visible sacrum and SI joints.   Conus medullaris and cauda equina: Conus extends to the T12-L1 level. No lower spinal cord or conus signal abnormality.   Paraspinal and other soft tissues: Negative.   Disc levels:   T11-T12: Negative.   T12-L1:  Negative.   L1-L2:  Negative.   L2-L3: Disc space loss. Mild circumferential disc bulge and endplate spurring, maximal at the left L2 neural foramen. Mild facet and ligament flavum hypertrophy. No spinal or lateral recess stenosis, but mild to moderate left L2 foraminal stenosis.   L3-L4: Disc desiccation with mild circumferential disc bulge and endplate spurring. Mild to moderate facet and ligament flavum hypertrophy. No spinal or convincing lateral recess stenosis. Borderline to mild L3 foraminal stenosis mostly on the left.   L4-L5: Disc space loss with circumferential disc osteophyte complex. Broad-based posterior and foraminal involvement with mild to moderate facet and ligament flavum hypertrophy. Mild spinal stenosis and bilateral lateral recess stenosis (L5 nerve levels). Mild to moderate bilateral L4 foraminal stenosis greater on the right.   L5-S1: Disc space loss. Circumferential disc osteophyte complex and mild facet hypertrophy but no stenosis.   IMPRESSION: 1. Mild  lumbar scoliosis. And multilevel degenerative endplate marrow edema associated with disc disease at L2-L3, L4-L5, and L5-S1. 2. Multifactorial mild spinal and bilateral lateral recess stenosis at L4-L5, with up to moderate bilateral L4 neural foraminal stenosis. Query L4 and/or L5 radiculitis. 3. No other lumbar spinal stenosis. Up to moderate left L2 neural foraminal stenosis.     Electronically Signed   By: Genevie Ann M.D.   On: 08/03/2021 09:22     PMFS History: Patient Active Problem List   Diagnosis Date Noted   Other intervertebral disc degeneration, lumbar region 09/08/2021   H/O sarcoidosis    History of BPH    History of Helicobacter pylori infection    GERD (gastroesophageal reflux disease)    COPD (chronic obstructive pulmonary disease) (HCC)    MVP (mitral valve prolapse)    Tubular adenoma of colon    Personal history of other malignant  neoplasm of skin    Past Medical History:  Diagnosis Date   COPD (chronic obstructive pulmonary disease) (HCC)    GERD (gastroesophageal reflux disease)    w/ hiatal hernia   H/O sarcoidosis    History of BPH    History of Helicobacter pylori infection    MVP (mitral valve prolapse)    MV regerg   Personal history of other malignant neoplasm of skin    Tubular adenoma of colon     Family History  Problem Relation Age of Onset   Cancer Mother        ovarian   Cancer Father        pancreatic    Past Surgical History:  Procedure Laterality Date   APPENDECTOMY     HERNIA REPAIR     TONSILLECTOMY     Social History   Occupational History   Not on file  Tobacco Use   Smoking status: Never   Smokeless tobacco: Never  Substance and Sexual Activity   Alcohol use: No   Drug use: No   Sexual activity: Not Currently

## 2021-09-17 ENCOUNTER — Other Ambulatory Visit: Payer: Self-pay

## 2021-09-17 ENCOUNTER — Ambulatory Visit (INDEPENDENT_AMBULATORY_CARE_PROVIDER_SITE_OTHER): Payer: Medicare PPO | Admitting: *Deleted

## 2021-09-17 DIAGNOSIS — Z23 Encounter for immunization: Secondary | ICD-10-CM | POA: Diagnosis not present

## 2021-09-22 DIAGNOSIS — H52203 Unspecified astigmatism, bilateral: Secondary | ICD-10-CM | POA: Diagnosis not present

## 2021-09-22 DIAGNOSIS — H26493 Other secondary cataract, bilateral: Secondary | ICD-10-CM | POA: Diagnosis not present

## 2021-10-02 ENCOUNTER — Encounter: Payer: Medicare PPO | Admitting: Family Medicine

## 2021-11-05 ENCOUNTER — Telehealth: Payer: Self-pay | Admitting: Family Medicine

## 2021-11-05 NOTE — Telephone Encounter (Signed)
Left message for patient to call back and schedule Medicare Annual Wellness Visit (AWV) in office.   If not able to come in office, please offer to do virtually or by telephone.   Last AWV: 09/29/2020     Please schedule at anytime with [facility]Nurse Health Advisor.  If any questions, please contact me at [phone]

## 2021-11-10 ENCOUNTER — Other Ambulatory Visit: Payer: Self-pay

## 2021-11-10 ENCOUNTER — Encounter: Payer: Self-pay | Admitting: Family Medicine

## 2021-11-10 ENCOUNTER — Ambulatory Visit (INDEPENDENT_AMBULATORY_CARE_PROVIDER_SITE_OTHER): Payer: Medicare PPO | Admitting: Family Medicine

## 2021-11-10 VITALS — BP 108/62 | HR 89 | Resp 18 | Ht 75.0 in | Wt 146.0 lb

## 2021-11-10 DIAGNOSIS — Z13220 Encounter for screening for lipoid disorders: Secondary | ICD-10-CM | POA: Diagnosis not present

## 2021-11-10 DIAGNOSIS — E785 Hyperlipidemia, unspecified: Secondary | ICD-10-CM | POA: Diagnosis not present

## 2021-11-10 DIAGNOSIS — Z0000 Encounter for general adult medical examination without abnormal findings: Secondary | ICD-10-CM

## 2021-11-10 LAB — COMPLETE METABOLIC PANEL WITH GFR
AG Ratio: 1.8 (calc) (ref 1.0–2.5)
ALT: 15 U/L (ref 9–46)
AST: 20 U/L (ref 10–35)
Albumin: 4.4 g/dL (ref 3.6–5.1)
Alkaline phosphatase (APISO): 64 U/L (ref 35–144)
BUN: 14 mg/dL (ref 7–25)
CO2: 33 mmol/L — ABNORMAL HIGH (ref 20–32)
Calcium: 9.9 mg/dL (ref 8.6–10.3)
Chloride: 101 mmol/L (ref 98–110)
Creat: 0.97 mg/dL (ref 0.70–1.22)
Globulin: 2.5 g/dL (calc) (ref 1.9–3.7)
Glucose, Bld: 88 mg/dL (ref 65–99)
Potassium: 4.8 mmol/L (ref 3.5–5.3)
Sodium: 141 mmol/L (ref 135–146)
Total Bilirubin: 0.9 mg/dL (ref 0.2–1.2)
Total Protein: 6.9 g/dL (ref 6.1–8.1)
eGFR: 76 mL/min/{1.73_m2} (ref >=60)

## 2021-11-10 LAB — LIPID PANEL
Cholesterol: 168 mg/dL (ref <200)
HDL: 68 mg/dL (ref >=40)
LDL Cholesterol (Calc): 79 mg/dL (calc)
Non-HDL Cholesterol (Calc): 100 mg/dL (calc) (ref <130)
Total CHOL/HDL Ratio: 2.5 (calc) (ref <5.0)
Triglycerides: 117 mg/dL (ref <150)

## 2021-11-10 LAB — CBC WITH DIFFERENTIAL/PLATELET
Absolute Monocytes: 545 cells/uL (ref 200–950)
Basophils Absolute: 38 cells/uL (ref 0–200)
Basophils Relative: 0.8 %
Eosinophils Absolute: 141 cells/uL (ref 15–500)
Eosinophils Relative: 3 %
HCT: 48.4 % (ref 38.5–50.0)
Hemoglobin: 16.1 g/dL (ref 13.2–17.1)
Lymphs Abs: 1391 cells/uL (ref 850–3900)
MCH: 32 pg (ref 27.0–33.0)
MCHC: 33.3 g/dL (ref 32.0–36.0)
MCV: 96.2 fL (ref 80.0–100.0)
MPV: 11.1 fL (ref 7.5–12.5)
Monocytes Relative: 11.6 %
Neutro Abs: 2585 cells/uL (ref 1500–7800)
Neutrophils Relative %: 55 %
Platelets: 175 10*3/uL (ref 140–400)
RBC: 5.03 10*6/uL (ref 4.20–5.80)
RDW: 12.5 % (ref 11.0–15.0)
Total Lymphocyte: 29.6 %
WBC: 4.7 10*3/uL (ref 3.8–10.8)

## 2021-11-10 NOTE — Progress Notes (Signed)
Subjective:    Patient ID: Bradley Beasley, male    DOB: 1933-04-11, 87 y.o.   MRN: [id number]  HPI Patient is here today for a complete physical exam. He denies any falls. He denies any depression. He denies any memory loss. He is still very active around his home. He works every day in his garden.  He is still playing golf although he states that he is lost some distance on his drives as he has gotten older.  He just celebrated his 63th birthday.  His immunizations are up-to-date including his flu shot, the recent mild veiling COVID-vaccine, Pneumovax, Prevnar, and Shingrix. Immunization History  Administered Date(s) Administered   Fluad Quad(high Dose 65+) 08/30/2019, 09/17/2021   Influenza, High Dose Seasonal PF 09/13/2018   Influenza,inj,Quad PF,6+ Mos 09/05/2013, 09/03/2014, 09/07/2016, 09/02/2017, 08/22/2020   PFIZER(Purple Top)SARS-COV-2 Vaccination 09/22/2020, 04/17/2021   Pneumococcal Conjugate-13 11/12/2013   Pneumococcal Polysaccharide-23 01/10/2006   Tdap 08/11/2011   Zoster Recombinat (Shingrix) 12/22/2017, 03/23/2018   Zoster, Live 11/23/2007   Due to his age, he does not require prostate cancer screening or colon cancer screening. Wt Readings from Last 3 Encounters:  11/10/21 146 lb (66.2 kg)  09/08/21 142 lb (64.4 kg)  07/02/21 142 lb (64.4 kg)   Patient's weight remains stable although he is very thin.  However this is a chronic issue for the patient.  He states that his wife has been trying to make him gain weight for 50 years Past Medical History:  Diagnosis Date   COPD (chronic obstructive pulmonary disease) (HCC)    GERD (gastroesophageal reflux disease)    w/ hiatal hernia   H/O sarcoidosis    History of BPH    History of Helicobacter pylori infection    MVP (mitral valve prolapse)    MV regerg   Personal history of other malignant neoplasm of skin    Tubular adenoma of colon    Past Surgical History:  Procedure Laterality Date   APPENDECTOMY      HERNIA REPAIR     TONSILLECTOMY     Current Outpatient Medications on File Prior to Visit  Medication Sig Dispense Refill   Cyanocobalamin (VITAMIN B 12 PO) Take by mouth.     No current facility-administered medications on file prior to visit.   Allergies  Allergen Reactions   Celebrex [Celecoxib] Nausea And Vomiting   Codeine    Prevacid [Lansoprazole]    Social History   Socioeconomic History   Marital status: Married    Spouse name: Not on file   Number of children: Not on file   Years of education: Not on file   Highest education level: Not on file  Occupational History   Not on file  Tobacco Use   Smoking status: Never   Smokeless tobacco: Never  Substance and Sexual Activity   Alcohol use: No   Drug use: No   Sexual activity: Not Currently  Other Topics Concern   Not on file  Social History Narrative   Not on file   Social Determinants of Health   Financial Resource Strain: Not on file  Food Insecurity: Not on file  Transportation Needs: Not on file  Physical Activity: Not on file  Stress: Not on file  Social Connections: Not on file  Intimate Partner Violence: Not on file   Family History  Problem Relation Age of Onset   Cancer Mother        ovarian   Cancer Father  pancreatic       Review of Systems  All other systems reviewed and are negative.      Objective:   Physical Exam  Constitutional: He is oriented to person, place, and time. He appears well-developed and well-nourished. No distress.  HENT:  Head: Normocephalic and atraumatic.  Right Ear: External ear normal.  Left Ear: External ear normal.  Nose: Nose normal.  Mouth/Throat: Oropharynx is clear and moist. No oropharyngeal exudate.  Eyes: Conjunctivae and EOM are normal. Pupils are equal, round, and reactive to light. Right eye exhibits no discharge. Left eye exhibits no discharge. No scleral icterus.  Neck: Normal range of motion. Neck supple. No JVD present. No tracheal  deviation present. No thyromegaly present.  Cardiovascular: Normal rate, regular rhythm, normal heart sounds and intact distal pulses.  Exam reveals no gallop and no friction rub.   No murmur heard. Pulmonary/Chest: Effort normal and breath sounds normal. No stridor. No respiratory distress. He has no wheezes. He has no rales. He exhibits no tenderness.  Abdominal: Soft. Bowel sounds are normal. He exhibits no distension and no mass. There is no tenderness. There is no rebound and no guarding.  Musculoskeletal: Normal range of motion. He exhibits no edema or tenderness.  Lymphadenopathy:    He has no cervical adenopathy.  Neurological: He is alert and oriented to person, place, and time. He has normal reflexes. He displays normal reflexes. No cranial nerve deficit. He exhibits normal muscle tone. Coordination normal.  Skin: Skin is warm. No rash noted. He is not diaphoretic. No erythema. No pallor.  Psychiatric: He has a normal mood and affect. His behavior is normal. Judgment and thought content normal.  Vitals reviewed.         Assessment & Plan:   Screening cholesterol level - Plan: CBC with Differential/Platelet, COMPLETE METABOLIC PANEL WITH GFR, Lipid panel  General medical exam Physical exam today is normal.  I did encourage the patient to drink Ensure with some type of protein supplement to help maintain his weight and his energy level.  Immunizations are up-to-date.  He does not require any cancer screening.  I will check a CBC CMP and a lipid panel.  Otherwise the patient is doing very well.  He denies any memory loss, falls, pression.

## 2022-05-18 ENCOUNTER — Ambulatory Visit: Payer: Medicare PPO | Admitting: Family Medicine

## 2022-05-18 ENCOUNTER — Encounter: Payer: Self-pay | Admitting: Family Medicine

## 2022-05-18 VITALS — BP 128/80 | HR 64 | Wt 148.8 lb

## 2022-05-18 DIAGNOSIS — M26622 Arthralgia of left temporomandibular joint: Secondary | ICD-10-CM | POA: Diagnosis not present

## 2022-05-18 MED ORDER — MELOXICAM 15 MG PO TABS: 15.0000 mg | ORAL_TABLET | Freq: Every day | ORAL | 0 refills | Status: DC

## 2022-05-18 MED ORDER — DICLOFENAC SODIUM 1 % EX GEL: 2.0000 g | Freq: Four times a day (QID) | CUTANEOUS | 1 refills | Status: DC

## 2022-05-18 NOTE — Progress Notes (Signed)
Subjective:    Patient ID: Bradley Beasley, male    DOB: [birth year], 88 y.o.   MRN: [id number]  HPI Patient reports 4 weeks of pain in his left temporomandibular joint.  He reports crepitus with chewing and opening his mouth.  He reports pain with chewing.  He also occasionally has a locking sensation when he tries to open his mouth wider.  He saw his dentist who did x-rays.  No abnormalities in his teeth.  Examination of his auditory canal today is normal.  The tympanic membrane appears normal.  His posterior oropharynx appears normal.  There is no lymphadenopathy in the neck there is palpable crepitus and tenderness and pain around the temporomandibular joint Past Medical History:  Diagnosis Date   COPD (chronic obstructive pulmonary disease) (HCC)    GERD (gastroesophageal reflux disease)    w/ hiatal hernia   H/O sarcoidosis    History of BPH    History of Helicobacter pylori infection    MVP (mitral valve prolapse)    MV regerg   Personal history of other malignant neoplasm of skin    Tubular adenoma of colon    Past Surgical History:  Procedure Laterality Date   APPENDECTOMY     HERNIA REPAIR     TONSILLECTOMY     Current Outpatient Medications on File Prior to Visit  Medication Sig Dispense Refill   Cyanocobalamin (VITAMIN B 12 PO) Take by mouth.     No current facility-administered medications on file prior to visit.   Allergies  Allergen Reactions   Celebrex [Celecoxib] Nausea And Vomiting   Codeine    Prevacid [Lansoprazole]    Social History   Socioeconomic History   Marital status: Married    Spouse name: Not on file   Number of children: Not on file   Years of education: Not on file   Highest education level: Not on file  Occupational History   Not on file  Tobacco Use   Smoking status: Never   Smokeless tobacco: Never  Vaping Use   Vaping Use: Never used  Substance and Sexual Activity   Alcohol use: No   Drug use: No   Sexual activity: Not  Currently  Other Topics Concern   Not on file  Social History Narrative   Not on file   Social Determinants of Health   Financial Resource Strain: Not on file  Food Insecurity: Not on file  Transportation Needs: Not on file  Physical Activity: Not on file  Stress: Not on file  Social Connections: Not on file  Intimate Partner Violence: Not on file   Family History  Problem Relation Age of Onset   Cancer Mother        ovarian   Cancer Father        pancreatic       Review of Systems  All other systems reviewed and are negative.      Objective:   Physical Exam  Constitutional: He is oriented to person, place, and time. He appears well-developed and well-nourished. No distress.  HENT:  Head: Normocephalic and atraumatic.  Right Ear: External ear normal.  Left Ear: External ear normal.  Nose: Nose normal.  Mouth/Throat: Oropharynx is clear and moist. No oropharyngeal exudate.  Eyes: Conjunctivae and EOM are normal. Pupils are equal, round, and reactive to light. Right eye exhibits no discharge. Left eye exhibits no discharge. No scleral icterus.  Neck: Normal range of motion. Neck supple. No JVD present. No tracheal deviation  present. No thyromegaly present.  Cardiovascular: Normal rate, regular rhythm, normal heart sounds and intact distal pulses.  Exam reveals no gallop and no friction rub.   No murmur heard. Pulmonary/Chest: Effort normal and breath sounds normal. No stridor. No respiratory distress. He has no wheezes. He has no rales. He exhibits no tenderness.  Please see history of present illness        Assessment & Plan:  Arthralgia of left temporomandibular joint Recommended trying meloxicam 15 mg a day for 3 to 4 weeks to calm down the irritation in the joint and then switching to topical Voltaren gel as a maintenance therapy to prevent aggravation in the future.  Also recommended avoiding chewy foods such as hard steak etc. that irritate the joint

## 2022-05-24 ENCOUNTER — Ambulatory Visit: Payer: Medicare PPO | Admitting: Family Medicine

## 2022-05-31 ENCOUNTER — Ambulatory Visit: Payer: Medicare PPO | Admitting: Family Medicine

## 2022-06-22 ENCOUNTER — Ambulatory Visit (INDEPENDENT_AMBULATORY_CARE_PROVIDER_SITE_OTHER): Payer: Medicare PPO | Admitting: Family Medicine

## 2022-06-22 VITALS — BP 110/50 | HR 85 | Ht 75.0 in | Wt 142.8 lb

## 2022-06-22 DIAGNOSIS — M79605 Pain in left leg: Secondary | ICD-10-CM

## 2022-06-22 NOTE — Progress Notes (Signed)
Subjective:    Patient ID: Bradley Beasley, male    DOB: [birth year], 88 y.o.   MRN: [id number]  Leg Pain       Patient is a very pleasant 88 year old Caucasian male here today for a general checkup.  He complains of bilateral leg pain.  The pain is primarily in his quadriceps and in his hamstrings.  He states that every morning his legs ache and throb.  They get better the more he walks on him suggesting against peripheral vascular disease.  This occurred last year as well.  Has DDD in [place].  MRI shows: 1. Mild lumbar scoliosis. And multilevel degenerative endplate marrow edema associated with disc disease at L2-L3, L4-L5, and L5-S1. 2. Multifactorial mild spinal and bilateral lateral recess stenosis at L4-L5, with up to moderate bilateral L4 neural foraminal stenosis. Query L4 and/or L5 radiculitis. 3. No other lumbar spinal stenosis. Up to moderate left L2 neural foraminal stenosis.   I question if some of this pain could be neuropathic referred pain from that.  Has not had xrays or eval for paget's disease or multiple myeloma.  Past Medical History:  Diagnosis Date   COPD (chronic obstructive pulmonary disease) (HCC)    GERD (gastroesophageal reflux disease)    w/ hiatal hernia   H/O sarcoidosis    History of BPH    History of Helicobacter pylori infection    MVP (mitral valve prolapse)    MV regerg   Personal history of other malignant neoplasm of skin    Tubular adenoma of colon    Past Surgical History:  Procedure Laterality Date   APPENDECTOMY     HERNIA REPAIR     TONSILLECTOMY     Current Outpatient Medications on File Prior to Visit  Medication Sig Dispense Refill   Cyanocobalamin (VITAMIN B 12 PO) Take by mouth.     diclofenac Sodium (VOLTAREN) 1 % GEL Apply 2 g topically 4 (four) times daily. 50 g 1   meloxicam (MOBIC) 15 MG tablet Take 1 tablet (15 mg total) by mouth daily. 30 tablet 0   No current facility-administered medications on file prior to visit.    Allergies  Allergen Reactions   Celebrex [Celecoxib] Nausea And Vomiting   Codeine    Prevacid [Lansoprazole]    Social History   Socioeconomic History   Marital status: Married    Spouse name: Not on file   Number of children: Not on file   Years of education: Not on file   Highest education level: Not on file  Occupational History   Not on file  Tobacco Use   Smoking status: Never   Smokeless tobacco: Never  Vaping Use   Vaping Use: Never used  Substance and Sexual Activity   Alcohol use: No   Drug use: No   Sexual activity: Not Currently  Other Topics Concern   Not on file  Social History Narrative   Not on file   Social Determinants of Health   Financial Resource Strain: Not on file  Food Insecurity: Not on file  Transportation Needs: Not on file  Physical Activity: Not on file  Stress: Not on file  Social Connections: Not on file  Intimate Partner Violence: Not on file   Family History  Problem Relation Age of Onset   Cancer Mother        ovarian   Cancer Father        pancreatic    Family History  Problem Relation Age of  Onset   Cancer Mother        ovarian   Cancer Father        pancreatic      Review of Systems     Objective:   Physical Exam Vitals reviewed.  Constitutional:      Appearance: Normal appearance. He is normal weight.  Cardiovascular:     Rate and Rhythm: Normal rate and regular rhythm.     Pulses: Normal pulses.     Heart sounds: Normal heart sounds. No murmur heard.    No friction rub. No gallop.  Pulmonary:     Effort: Pulmonary effort is normal.     Breath sounds: Normal breath sounds.  Musculoskeletal:        General: No deformity.     Right lower leg: No edema.     Left lower leg: No edema.       Legs:  Skin:    Coloration: Skin is not jaundiced.     Findings: No bruising or erythema.  Neurological:     General: No focal deficit present.     Mental Status: He is alert and oriented to person, place, and  time.     Cranial Nerves: No cranial nerve deficit.     Motor: No weakness.     Coordination: Coordination normal.     Gait: Gait normal.           Assessment & Plan:  Left leg pain - Plan: DG FEMUR MIN 2 VIEWS LEFT, Protein electrophoresis, serum Patient states that this has been gradually going on the last 2 to 3 years.  Suspect lumbar radiculopathy.  Get xray of thigh to rule out skeletal lesion or paget's disease. Check spep.  If normal, try gabapentin.

## 2022-06-23 ENCOUNTER — Ambulatory Visit
Admission: RE | Admit: 2022-06-23 | Discharge: 2022-06-23 | Disposition: A | Discharge status: Home / Self Care | Payer: Medicare PPO | Source: Ambulatory Visit | Attending: Family Medicine | Admitting: Family Medicine

## 2022-06-23 DIAGNOSIS — M1712 Unilateral primary osteoarthritis, left knee: Secondary | ICD-10-CM | POA: Diagnosis not present

## 2022-06-23 DIAGNOSIS — M79605 Pain in left leg: Secondary | ICD-10-CM

## 2022-06-23 DIAGNOSIS — M79652 Pain in left thigh: Secondary | ICD-10-CM | POA: Diagnosis not present

## 2022-06-23 DIAGNOSIS — G8929 Other chronic pain: Secondary | ICD-10-CM | POA: Diagnosis not present

## 2022-06-24 LAB — PROTEIN ELECTROPHORESIS, SERUM
Albumin ELP: 4.6 g/dL (ref 3.8–4.8)
Alpha 1: 0.2 g/dL (ref 0.2–0.3)
Alpha 2: 0.7 g/dL (ref 0.5–0.9)
Beta 2: 0.4 g/dL (ref 0.2–0.5)
Beta Globulin: 0.4 g/dL (ref 0.4–0.6)
Gamma Globulin: 1 g/dL (ref 0.8–1.7)
Total Protein: 7.2 g/dL (ref 6.1–8.1)

## 2022-06-25 ENCOUNTER — Other Ambulatory Visit: Payer: Self-pay | Admitting: Family Medicine

## 2022-06-25 MED ORDER — GABAPENTIN 100 MG PO CAPS: 100.0000 mg | ORAL_CAPSULE | Freq: Three times a day (TID) | ORAL | 3 refills | Status: DC | PRN

## 2022-07-06 DIAGNOSIS — M4800 Spinal stenosis, site unspecified: Secondary | ICD-10-CM | POA: Diagnosis not present

## 2022-07-06 DIAGNOSIS — R32 Unspecified urinary incontinence: Secondary | ICD-10-CM | POA: Diagnosis not present

## 2022-07-06 DIAGNOSIS — Z85828 Personal history of other malignant neoplasm of skin: Secondary | ICD-10-CM | POA: Diagnosis not present

## 2022-07-06 DIAGNOSIS — Z809 Family history of malignant neoplasm, unspecified: Secondary | ICD-10-CM | POA: Diagnosis not present

## 2022-07-06 DIAGNOSIS — Z7409 Other reduced mobility: Secondary | ICD-10-CM | POA: Diagnosis not present

## 2022-07-06 DIAGNOSIS — N529 Male erectile dysfunction, unspecified: Secondary | ICD-10-CM | POA: Diagnosis not present

## 2022-07-06 DIAGNOSIS — G629 Polyneuropathy, unspecified: Secondary | ICD-10-CM | POA: Diagnosis not present

## 2022-07-21 ENCOUNTER — Telehealth: Payer: Self-pay

## 2022-07-21 NOTE — Telephone Encounter (Signed)
Pt called today stating that he has completed his gel per Rx directions. Pt stated that the ' crunchy noise' is gone, however, the sore is still there.   Pls advice

## 2022-07-22 ENCOUNTER — Other Ambulatory Visit: Payer: Self-pay

## 2022-07-22 DIAGNOSIS — S0300XA Dislocation of jaw, unspecified side, initial encounter: Secondary | ICD-10-CM

## 2022-07-22 NOTE — Telephone Encounter (Signed)
LM w/pt's wife to have call back regard his jaw pain.

## 2022-07-22 NOTE — Telephone Encounter (Signed)
Spoke w/pt and agreed to do the ENT consult for TMJ per Dr. Dennard Schaumann  Referral sent in

## 2022-08-23 ENCOUNTER — Telehealth: Payer: Self-pay

## 2022-08-23 DIAGNOSIS — L820 Inflamed seborrheic keratosis: Secondary | ICD-10-CM | POA: Diagnosis not present

## 2022-08-23 DIAGNOSIS — L570 Actinic keratosis: Secondary | ICD-10-CM | POA: Diagnosis not present

## 2022-08-23 DIAGNOSIS — L821 Other seborrheic keratosis: Secondary | ICD-10-CM | POA: Diagnosis not present

## 2022-08-23 DIAGNOSIS — Z85828 Personal history of other malignant neoplasm of skin: Secondary | ICD-10-CM | POA: Diagnosis not present

## 2022-08-23 DIAGNOSIS — D485 Neoplasm of uncertain behavior of skin: Secondary | ICD-10-CM | POA: Diagnosis not present

## 2022-08-23 DIAGNOSIS — C44519 Basal cell carcinoma of skin of other part of trunk: Secondary | ICD-10-CM | POA: Diagnosis not present

## 2022-08-23 NOTE — Telephone Encounter (Signed)
Pt called in inquiring about a referral for his jaw. Provided number for pt to call office to try and schedule his appt. Pt states he called and was told by [facility] that they had not received a referral from this office. Pt has provided a fax number for referral to sent to. Please advise.  Fax for [facility] [phone]

## 2022-08-24 ENCOUNTER — Other Ambulatory Visit: Payer: Self-pay

## 2022-08-24 DIAGNOSIS — S0300XA Dislocation of jaw, unspecified side, initial encounter: Secondary | ICD-10-CM

## 2022-08-27 ENCOUNTER — Ambulatory Visit: Payer: Medicare PPO | Admitting: Family Medicine

## 2022-09-07 ENCOUNTER — Telehealth: Payer: Self-pay

## 2022-09-07 NOTE — Telephone Encounter (Signed)
Pt called in stating that he is still having issues with his jaw. Pt states that the dr he was referred to could not help him. Pt wanted to know if pcp needed to see him again in office or if he needed to try another referral for another specialist that may be able to help him. Pt would like a cb please.  Cb#: [phone]

## 2022-09-20 ENCOUNTER — Ambulatory Visit: Payer: Medicare PPO | Admitting: Family Medicine

## 2022-09-20 VITALS — BP 120/76 | HR 84 | Ht 75.0 in | Wt 144.0 lb

## 2022-09-20 DIAGNOSIS — M26602 Left temporomandibular joint disorder, unspecified: Secondary | ICD-10-CM

## 2022-09-20 DIAGNOSIS — M26609 Unspecified temporomandibular joint disorder, unspecified side: Secondary | ICD-10-CM

## 2022-09-20 DIAGNOSIS — M79605 Pain in left leg: Secondary | ICD-10-CM

## 2022-09-20 NOTE — Progress Notes (Signed)
Subjective:    Patient ID: Bradley Beasley, male    DOB: 1933/09/29, 88 y.o.   MRN: [id number]  Leg Pain   Knee Pain     Patient presents today with 2 concerns.  First he reports pain in his left leg.  I will perform an x-ray of his femur which showed degenerative changes in his knee but was otherwise negative.  At that time the pain was more in his thigh and I wanted to rule out a skeletal based malignancy in the bone.  However he now believes it may be referred from his knee.  He reports pain in his knee underneath his kneecap.  He reports pain over the medial and lateral joint line.  He reports a feeling of instability in the knee and like there is catching in the knee when he walks.  He does have medial compartment arthritis as well as degenerative changes beneath the patella.  Second issue is TMJ.  I have tried him on prednisone as well as NSAIDs for TMJ with no relief.  He continues to have pain in his jaw when he chews.  It can be either the left jaw over the right jaw.  He is unable to open his mouth fully. Past Medical History:  Diagnosis Date   COPD (chronic obstructive pulmonary disease) (HCC)    GERD (gastroesophageal reflux disease)    w/ hiatal hernia   H/O sarcoidosis    History of BPH    History of Helicobacter pylori infection    MVP (mitral valve prolapse)    MV regerg   Personal history of other malignant neoplasm of skin    Tubular adenoma of colon    Past Surgical History:  Procedure Laterality Date   APPENDECTOMY     HERNIA REPAIR     TONSILLECTOMY     Current Outpatient Medications on File Prior to Visit  Medication Sig Dispense Refill   Cyanocobalamin (VITAMIN B 12 PO) Take by mouth.     diclofenac Sodium (VOLTAREN) 1 % GEL Apply 2 g topically 4 (four) times daily. 50 g 1   gabapentin (NEURONTIN) 100 MG capsule Take 1 capsule (100 mg total) by mouth 3 (three) times daily as needed (nerve pain in the legs). 90 capsule 3   meloxicam (MOBIC) 15 MG tablet Take  1 tablet (15 mg total) by mouth daily. 30 tablet 0   No current facility-administered medications on file prior to visit.   Allergies  Allergen Reactions   Celebrex [Celecoxib] Nausea And Vomiting   Codeine    Prevacid [Lansoprazole]    Social History   Socioeconomic History   Marital status: Married    Spouse name: Not on file   Number of children: Not on file   Years of education: Not on file   Highest education level: Not on file  Occupational History   Not on file  Tobacco Use   Smoking status: Never   Smokeless tobacco: Never  Vaping Use   Vaping Use: Never used  Substance and Sexual Activity   Alcohol use: No   Drug use: No   Sexual activity: Not Currently  Other Topics Concern   Not on file  Social History Narrative   Not on file   Social Determinants of Health   Financial Resource Strain: Not on file  Food Insecurity: Not on file  Transportation Needs: Not on file  Physical Activity: Not on file  Stress: Not on file  Social Connections: Not on file  Intimate Partner Violence: Not on file   Family History  Problem Relation Age of Onset   Cancer Mother        ovarian   Cancer Father        pancreatic    Family History  Problem Relation Age of Onset   Cancer Mother        ovarian   Cancer Father        pancreatic      Review of Systems     Objective:   Physical Exam Vitals reviewed.  Constitutional:      Appearance: Normal appearance. He is normal weight.  Cardiovascular:     Rate and Rhythm: Normal rate and regular rhythm.     Pulses: Normal pulses.     Heart sounds: Normal heart sounds. No murmur heard.    No friction rub. No gallop.  Pulmonary:     Effort: Pulmonary effort is normal.     Breath sounds: Normal breath sounds.  Musculoskeletal:        General: No deformity.     Right lower leg: No edema.     Left lower leg: No edema.       Legs:  Skin:    Coloration: Skin is not jaundiced.     Findings: No bruising or  erythema.  Neurological:     General: No focal deficit present.     Mental Status: He is alert and oriented to person, place, and time.     Cranial Nerves: No cranial nerve deficit.     Motor: No weakness.     Coordination: Coordination normal.     Gait: Gait normal.           Assessment & Plan:  TMJ (temporomandibular joint disorder) - Plan: Ambulatory referral to Oral Maxillofacial Surgery  Left leg pain I will send a referral to oral maxillofacial surgery to see if they can help with the TMJ.  Perhaps they can do a cortisone injection in the joint to calm down the pain.  We have tried NSAIDs and prednisone without relief.  Regarding the pain in his left leg, it could certainly be coming from his knee.  The other possibility would be some type of neuropathy.  We decided to try cortisone injection in the left knee given some of his symptoms.  Using sterile technique, I injected the left knee with 2 cc lidocaine, 2 cc of Marcaine, and 2 cc of 40 mg per mill Kenalog.

## 2022-09-21 ENCOUNTER — Telehealth: Payer: Self-pay

## 2022-09-21 NOTE — Telephone Encounter (Signed)
Mateo Flow, Nurse for Dr. Michael Litter with the [facility] called to advise that Dr. Mancel Parsons does not treat TMJ. They do not recommend any other Oral Surgeons for TMJ, Mateo Flow states we just would have to re-refer the patient to another Chief Financial Officer. Thank you.

## 2022-09-23 ENCOUNTER — Other Ambulatory Visit: Payer: Self-pay | Admitting: Family Medicine

## 2022-09-23 DIAGNOSIS — M26629 Arthralgia of temporomandibular joint, unspecified side: Secondary | ICD-10-CM

## 2022-09-29 DIAGNOSIS — H26493 Other secondary cataract, bilateral: Secondary | ICD-10-CM | POA: Diagnosis not present

## 2022-09-29 DIAGNOSIS — Z961 Presence of intraocular lens: Secondary | ICD-10-CM | POA: Diagnosis not present

## 2022-09-29 DIAGNOSIS — H52203 Unspecified astigmatism, bilateral: Secondary | ICD-10-CM | POA: Diagnosis not present

## 2022-10-04 ENCOUNTER — Ambulatory Visit (INDEPENDENT_AMBULATORY_CARE_PROVIDER_SITE_OTHER): Payer: Medicare PPO

## 2022-10-04 DIAGNOSIS — Z23 Encounter for immunization: Secondary | ICD-10-CM | POA: Diagnosis not present

## 2022-10-14 ENCOUNTER — Telehealth: Payer: Self-pay

## 2022-10-14 NOTE — Telephone Encounter (Signed)
Pt's daughter call asked if you would recommend pt and pt's wife to get the RSV vaccine?  I did tell pt that should be ok to get it.  Pls advice?

## 2022-10-14 NOTE — Telephone Encounter (Signed)
Spoke w/daughter aware of providers suggestion. Voiced understanding and nothing further.

## 2022-11-12 ENCOUNTER — Encounter: Payer: Self-pay | Admitting: Family Medicine

## 2022-11-12 ENCOUNTER — Ambulatory Visit (INDEPENDENT_AMBULATORY_CARE_PROVIDER_SITE_OTHER): Payer: Medicare PPO | Admitting: Family Medicine

## 2022-11-12 VITALS — BP 118/68 | HR 80 | Ht 75.0 in | Wt 142.4 lb

## 2022-11-12 DIAGNOSIS — Z13220 Encounter for screening for lipoid disorders: Secondary | ICD-10-CM

## 2022-11-12 DIAGNOSIS — Z0000 Encounter for general adult medical examination without abnormal findings: Secondary | ICD-10-CM

## 2022-11-12 NOTE — Progress Notes (Signed)
Subjective:    Patient ID: Bradley Beasley, male    DOB: 10/12/1933, 88 y.o.   MRN: [id number]  HPI Patient is an 88 year old Caucasian gentleman here today for complete physical exam.  Recently he has been battling TMJ however this has improved.  He denies any headache or blurry vision to suggest temporal arteritis.  Otherwise he been doing well.  He does have some pain at the right first [facility] joint.  Denies chest pain shortness of breath or dyspnea exertion.  His immunizations are up-to-date except for the tetanus shot.  He is even had the most recent COVID shot and RSV vaccination.  He declines a tetanus shot today.  He denies any falls or memory loss or depression.  He has lost some weight and is very thin.  He admits that he is not eating enough protein. Past Medical History:  Diagnosis Date   COPD (chronic obstructive pulmonary disease) (HCC)    GERD (gastroesophageal reflux disease)    w/ hiatal hernia   H/O sarcoidosis    History of BPH    History of Helicobacter pylori infection    MVP (mitral valve prolapse)    MV regerg   Personal history of other malignant neoplasm of skin    Tubular adenoma of colon    Past Surgical History:  Procedure Laterality Date   APPENDECTOMY     HERNIA REPAIR     TONSILLECTOMY     Current Outpatient Medications on File Prior to Visit  Medication Sig Dispense Refill   COMIRNATY SUSP injection Inject into the muscle.     Cyanocobalamin (VITAMIN B 12 PO) Take by mouth.     No current facility-administered medications on file prior to visit.   Allergies  Allergen Reactions   Celebrex [Celecoxib] Nausea And Vomiting   Codeine    Prevacid [Lansoprazole]    Social History   Socioeconomic History   Marital status: Married    Spouse name: Not on file   Number of children: Not on file   Years of education: Not on file   Highest education level: Not on file  Occupational History   Not on file  Tobacco Use   Smoking status: Never   Smokeless  tobacco: Never  Vaping Use   Vaping Use: Never used  Substance and Sexual Activity   Alcohol use: No   Drug use: No   Sexual activity: Not Currently  Other Topics Concern   Not on file  Social History Narrative   Not on file   Social Determinants of Health   Financial Resource Strain: Not on file  Food Insecurity: Not on file  Transportation Needs: Not on file  Physical Activity: Not on file  Stress: Not on file  Social Connections: Not on file  Intimate Partner Violence: Not on file   Family History  Problem Relation Age of Onset   Cancer Mother        ovarian   Cancer Father        pancreatic       Review of Systems  All other systems reviewed and are negative.      Objective:   Physical Exam  Constitutional: He is oriented to person, place, and time. He appears well-developed and well-nourished. No distress.  HENT:  Head: Normocephalic and atraumatic.  Right Ear: External ear normal.  Left Ear: External ear normal.  Nose: Nose normal.  Mouth/Throat: Oropharynx is clear and moist. No oropharyngeal exudate.  Eyes: Conjunctivae and EOM are  normal. Pupils are equal, round, and reactive to light. Right eye exhibits no discharge. Left eye exhibits no discharge. No scleral icterus.  Neck: Normal range of motion. Neck supple. No JVD present. No tracheal deviation present. No thyromegaly present.  Cardiovascular: Normal rate, regular rhythm, normal heart sounds and intact distal pulses.  Exam reveals no gallop and no friction rub.   No murmur heard. Pulmonary/Chest: Effort normal and breath sounds normal. No stridor. No respiratory distress. He has no wheezes. He has no rales. He exhibits no tenderness.  Abdominal: Soft. Bowel sounds are normal. He exhibits no distension and no mass. There is no tenderness. There is no rebound and no guarding.  Musculoskeletal: Normal range of motion. He exhibits no edema or tenderness.  Lymphadenopathy:    He has no cervical  adenopathy.  Neurological: He is alert and oriented to person, place, and time. He has normal reflexes. He displays normal reflexes. No cranial nerve deficit. He exhibits normal muscle tone. Coordination normal.  Skin: Skin is warm. No rash noted. He is not diaphoretic. No erythema. No pallor.  Psychiatric: He has a normal mood and affect. His behavior is normal. Judgment and thought content normal.  Vitals reviewed.         Assessment & Plan:   Screening cholesterol level - Plan: CBC with Differential/Platelet, Lipid panel, COMPLETE METABOLIC PANEL WITH GFR  General medical exam I am concerned that the patient is underweight and frail.  I recommended adding a protein supplement or eating eggs with breakfast to try to build more muscle mass.  Also recommended exercises such as squats and walking to try to build up the strength in his legs.  I will check a CBC a CMP and a lipid panel.  Blood pressure today is excellent.  Immunizations are up-to-date except for tetanus shot he politely defers the tetanus shot.  Due to his advanced age I do not recommend a colonoscopy or PSA as the patient is completely asymptomatic.  Otherwise doing well he denies any falls or memory loss or depression

## 2022-11-13 LAB — CBC WITH DIFFERENTIAL/PLATELET
Absolute Monocytes: 505 cells/uL (ref 200–950)
Basophils Absolute: 39 cells/uL (ref 0–200)
Basophils Relative: 0.8 %
Eosinophils Absolute: 98 cells/uL (ref 15–500)
Eosinophils Relative: 2 %
HCT: 46.3 % (ref 38.5–50.0)
Hemoglobin: 15.7 g/dL (ref 13.2–17.1)
Lymphs Abs: 1142 cells/uL (ref 850–3900)
MCH: 32.2 pg (ref 27.0–33.0)
MCHC: 33.9 g/dL (ref 32.0–36.0)
MCV: 95.1 fL (ref 80.0–100.0)
MPV: 10.7 fL (ref 7.5–12.5)
Monocytes Relative: 10.3 %
Neutro Abs: 3116 cells/uL (ref 1500–7800)
Neutrophils Relative %: 63.6 %
Platelets: 170 10*3/uL (ref 140–400)
RBC: 4.87 10*6/uL (ref 4.20–5.80)
RDW: 12.7 % (ref 11.0–15.0)
Total Lymphocyte: 23.3 %
WBC: 4.9 10*3/uL (ref 3.8–10.8)

## 2022-11-13 LAB — COMPLETE METABOLIC PANEL WITH GFR
AG Ratio: 1.5 (calc) (ref 1.0–2.5)
ALT: 13 U/L (ref 9–46)
AST: 20 U/L (ref 10–35)
Albumin: 4.4 g/dL (ref 3.6–5.1)
Alkaline phosphatase (APISO): 60 U/L (ref 35–144)
BUN: 17 mg/dL (ref 7–25)
CO2: 32 mmol/L (ref 20–32)
Calcium: 10.2 mg/dL (ref 8.6–10.3)
Chloride: 102 mmol/L (ref 98–110)
Creat: 0.97 mg/dL (ref 0.70–1.22)
Globulin: 2.9 g/dL (calc) (ref 1.9–3.7)
Glucose, Bld: 103 mg/dL — ABNORMAL HIGH (ref 65–99)
Potassium: 5.4 mmol/L — ABNORMAL HIGH (ref 3.5–5.3)
Sodium: 141 mmol/L (ref 135–146)
Total Bilirubin: 0.9 mg/dL (ref 0.2–1.2)
Total Protein: 7.3 g/dL (ref 6.1–8.1)
eGFR: 76 mL/min/{1.73_m2} (ref >=60)

## 2022-11-13 LAB — LIPID PANEL
Cholesterol: 188 mg/dL (ref <200)
HDL: 76 mg/dL (ref >=40)
LDL Cholesterol (Calc): 93 mg/dL (calc)
Non-HDL Cholesterol (Calc): 112 mg/dL (calc) (ref <130)
Total CHOL/HDL Ratio: 2.5 (calc) (ref <5.0)
Triglycerides: 97 mg/dL (ref <150)

## 2022-12-21 ENCOUNTER — Encounter: Payer: Self-pay | Admitting: Pediatrics

## 2022-12-21 DIAGNOSIS — R6884 Jaw pain: Secondary | ICD-10-CM

## 2022-12-22 ENCOUNTER — Encounter: Payer: Self-pay | Admitting: Dentist

## 2022-12-22 ENCOUNTER — Other Ambulatory Visit: Payer: Self-pay | Admitting: Dentist

## 2022-12-22 DIAGNOSIS — R6884 Jaw pain: Secondary | ICD-10-CM

## 2022-12-23 ENCOUNTER — Encounter: Payer: Self-pay | Admitting: Dentist

## 2023-01-03 ENCOUNTER — Encounter: Payer: Self-pay | Admitting: Dentist

## 2023-01-03 ENCOUNTER — Ambulatory Visit: Payer: Medicare PPO | Admitting: Family Medicine

## 2023-01-03 VITALS — BP 120/80 | HR 89 | Temp 98.0°F | Ht 75.0 in | Wt 142.0 lb

## 2023-01-03 DIAGNOSIS — L03115 Cellulitis of right lower limb: Secondary | ICD-10-CM

## 2023-01-03 MED ORDER — DOXYCYCLINE HYCLATE 100 MG PO TABS: 100.0000 mg | ORAL_TABLET | Freq: Two times a day (BID) | ORAL | 0 refills | Status: DC

## 2023-01-03 MED ORDER — SILVER SULFADIAZINE 1 % EX CREA: 1.0000 | TOPICAL_CREAM | Freq: Every day | CUTANEOUS | 0 refills | Status: DC

## 2023-01-03 NOTE — Progress Notes (Signed)
Subjective:    Patient ID: Bradley Beasley, male    DOB: [birth year], 89 y.o.   MRN: [id number]  1 week ago, the patient developed a red bump on his anterior right shin.  This was approximately 1 cm in diameter based on his description.  He heats his house with a wood stove so he has been working around a wood pile.  Shortly thereafter, the red papule developed into a white plaque that is roughly 2.5 cm in diameter.  This is becoming macerated and the skin is starting to breakdown and turn into an ulcer.  The surrounding skin is erythematous warm and tender Past Medical History:  Diagnosis Date   COPD (chronic obstructive pulmonary disease) (HCC)    GERD (gastroesophageal reflux disease)    w/ hiatal hernia   H/O sarcoidosis    History of BPH    History of Helicobacter pylori infection    MVP (mitral valve prolapse)    MV regerg   Personal history of other malignant neoplasm of skin    Tubular adenoma of colon    Past Surgical History:  Procedure Laterality Date   APPENDECTOMY     HERNIA REPAIR     TONSILLECTOMY     Current Outpatient Medications on File Prior to Visit  Medication Sig Dispense Refill   Cyanocobalamin (VITAMIN B 12 PO) Take by mouth.     COMIRNATY SUSP injection Inject into the muscle. (Patient not taking: Reported on 01/03/2023)     No current facility-administered medications on file prior to visit.   Allergies  Allergen Reactions   Celebrex [Celecoxib] Nausea And Vomiting   Codeine    Prevacid [Lansoprazole]    Social History   Socioeconomic History   Marital status: Married    Spouse name: Not on file   Number of children: Not on file   Years of education: Not on file   Highest education level: Not on file  Occupational History   Not on file  Tobacco Use   Smoking status: Never   Smokeless tobacco: Never  Vaping Use   Vaping Use: Never used  Substance and Sexual Activity   Alcohol use: No   Drug use: No   Sexual activity: Not Currently  Other  Topics Concern   Not on file  Social History Narrative   Not on file   Social Determinants of Health   Financial Resource Strain: Not on file  Food Insecurity: Not on file  Transportation Needs: Not on file  Physical Activity: Not on file  Stress: Not on file  Social Connections: Not on file  Intimate Partner Violence: Not on file   Family History  Problem Relation Age of Onset   Cancer Mother        ovarian   Cancer Father        pancreatic    Family History  Problem Relation Age of Onset   Cancer Mother        ovarian   Cancer Father        pancreatic      Review of Systems     Objective:   Physical Exam Vitals reviewed.  Constitutional:      Appearance: Normal appearance. He is normal weight.  Cardiovascular:     Rate and Rhythm: Normal rate and regular rhythm.     Pulses: Normal pulses.     Heart sounds: Normal heart sounds. No murmur heard.    No friction rub. No gallop.  Pulmonary:  Effort: Pulmonary effort is normal.     Breath sounds: Normal breath sounds.  Musculoskeletal:        General: No deformity.     Right lower leg: No edema.     Left lower leg: No edema.       Legs:  Skin:    Coloration: Skin is not jaundiced.     Findings: No bruising or erythema.  Neurological:     General: No focal deficit present.     Mental Status: He is alert and oriented to person, place, and time.     Cranial Nerves: No cranial nerve deficit.     Motor: No weakness.     Coordination: Coordination normal.     Gait: Gait normal.    2.5 cm white oozing plaque.  The surface is starting to breakdown and turn into a crater.  The skin surrounding this is erythematous warm and tender       Assessment & Plan:   Cellulitis of right lower extremity Based on the history and physical exam, I suspect that the patient suffered a brown recluse spider bite.  I believe that the venom is causing the skin to breakdown and develop an ulcer.  I believe that he is  developing a secondary cellulitis.  Will treat the wound from the spider bite by applying Silvadene to nonadhering gauze and covering this daily until healed.  Will treat the secondary cellulitis with doxycycline 100 mg p.o. twice daily for 7 days.  Recheck in 1 week or sooner if worse

## 2023-01-04 ENCOUNTER — Other Ambulatory Visit: Payer: Medicare PPO

## 2023-01-06 ENCOUNTER — Telehealth: Payer: Self-pay | Admitting: Family Medicine

## 2023-01-06 ENCOUNTER — Other Ambulatory Visit: Payer: Self-pay | Admitting: Family Medicine

## 2023-01-06 ENCOUNTER — Encounter: Payer: Self-pay | Admitting: Dentist

## 2023-01-06 DIAGNOSIS — G8929 Other chronic pain: Secondary | ICD-10-CM

## 2023-01-06 NOTE — Telephone Encounter (Signed)
Patient is requesting a MRI for TMJ he has contacted [facility] to set up the MRI but they don't have a referral or authorization. [place] from [place] office states she has been talking with Ashok Norris with [facility] [phone]. Per insurance he needs this referral from his PCP  I have also called patient since he has been a little upset with their office. I did leave a message.

## 2023-01-10 ENCOUNTER — Encounter: Payer: Self-pay | Admitting: Family Medicine

## 2023-01-10 ENCOUNTER — Ambulatory Visit: Payer: Medicare PPO | Admitting: Family Medicine

## 2023-01-10 VITALS — BP 132/72 | HR 87 | Temp 98.4°F | Ht 75.0 in | Wt 144.0 lb

## 2023-01-10 DIAGNOSIS — L989 Disorder of the skin and subcutaneous tissue, unspecified: Secondary | ICD-10-CM

## 2023-01-10 NOTE — Progress Notes (Signed)
Subjective:    Patient ID: Bradley Beasley, male    DOB: 1933/08/27, 89 y.o.   MRN: [id number] 01/03/23 1 week ago, the patient developed a red bump on his anterior right shin.  This was approximately 1 cm in diameter based on his description.  He heats his house with a wood stove so he has been working around a wood pile.  Shortly thereafter, the red papule developed into a white plaque that is roughly 2.5 cm in diameter.  This is becoming macerated and the skin is starting to breakdown and turn into an ulcer.  The surrounding skin is erythematous warm and tender.  At that time, my plan was: Based on the history and physical exam, I suspect that the patient suffered a brown recluse spider bite.  I believe that the venom is causing the skin to breakdown and develop an ulcer.  I believe that he is developing a secondary cellulitis.  Will treat the wound from the spider bite by applying Silvadene to nonadhering gauze and covering this daily until healed.  Will treat the secondary cellulitis with doxycycline 100 mg p.o. twice daily for 7 days.  Recheck in 1 week or sooner if worse  01/10/23   Surrounding cellulitis has resolved.  However the central lesion is still raised and a half arrival.  Difficult to see in the photograph above but the central lesion is approximately 2 cm in diameter.  There is friable granulation tissue in the center.  It is not improving.  If anything it may be getting larger. Past Medical History:  Diagnosis Date   COPD (chronic obstructive pulmonary disease) (HCC)    GERD (gastroesophageal reflux disease)    w/ hiatal hernia   H/O sarcoidosis    History of BPH    History of Helicobacter pylori infection    MVP (mitral valve prolapse)    MV regerg   Personal history of other malignant neoplasm of skin    Tubular adenoma of colon    Past Surgical History:  Procedure Laterality Date   APPENDECTOMY     HERNIA REPAIR     TONSILLECTOMY     Current Outpatient Medications  on File Prior to Visit  Medication Sig Dispense Refill   COMIRNATY SUSP injection Inject into the muscle. (Patient not taking: Reported on 01/03/2023)     Cyanocobalamin (VITAMIN B 12 PO) Take by mouth.     doxycycline (VIBRA-TABS) 100 MG tablet Take 1 tablet (100 mg total) by mouth 2 (two) times daily. 14 tablet 0   silver sulfADIAZINE (SILVADENE) 1 % cream Apply 1 Application topically daily. 50 g 0   No current facility-administered medications on file prior to visit.   Allergies  Allergen Reactions   Celebrex [Celecoxib] Nausea And Vomiting   Codeine    Prevacid [Lansoprazole]    Social History   Socioeconomic History   Marital status: Married    Spouse name: Not on file   Number of children: Not on file   Years of education: Not on file   Highest education level: Not on file  Occupational History   Not on file  Tobacco Use   Smoking status: Never   Smokeless tobacco: Never  Vaping Use   Vaping Use: Never used  Substance and Sexual Activity   Alcohol use: No   Drug use: No   Sexual activity: Not Currently  Other Topics Concern   Not on file  Social History Narrative   Not on file  Social Determinants of Health   Financial Resource Strain: Not on file  Food Insecurity: Not on file  Transportation Needs: Not on file  Physical Activity: Not on file  Stress: Not on file  Social Connections: Not on file  Intimate Partner Violence: Not on file   Family History  Problem Relation Age of Onset   Cancer Mother        ovarian   Cancer Father        pancreatic    Family History  Problem Relation Age of Onset   Cancer Mother        ovarian   Cancer Father        pancreatic      Review of Systems     Objective:   Physical Exam Vitals reviewed.  Constitutional:      Appearance: Normal appearance. He is normal weight.  Cardiovascular:     Rate and Rhythm: Normal rate and regular rhythm.     Pulses: Normal pulses.     Heart sounds: Normal heart sounds.  No murmur heard.    No friction rub. No gallop.  Pulmonary:     Effort: Pulmonary effort is normal.     Breath sounds: Normal breath sounds.  Musculoskeletal:        General: No deformity.     Right lower leg: No edema.     Left lower leg: No edema.       Legs:  Skin:    Coloration: Skin is not jaundiced.     Findings: No bruising or erythema.  Neurological:     General: No focal deficit present.     Mental Status: He is alert and oriented to person, place, and time.     Cranial Nerves: No cranial nerve deficit.     Motor: No weakness.     Coordination: Coordination normal.     Gait: Gait normal.    2.0 cm white oozing plaque.       Assessment & Plan:   Skin lesion of right leg I believe my previous assessment was wrong.  Although it was infected, I believe it is an underlying skin cancer that requires a biopsy.  I will get the patient into see his dermatologist for second opinion.  If he is unable to see his dermatologist I will perform biopsy himself

## 2023-01-13 ENCOUNTER — Ambulatory Visit
Admission: RE | Admit: 2023-01-13 | Discharge: 2023-01-13 | Disposition: A | Discharge status: Home / Self Care | Payer: Medicare PPO | Source: Ambulatory Visit | Attending: Dentist | Admitting: Dentist

## 2023-01-13 DIAGNOSIS — R6884 Jaw pain: Secondary | ICD-10-CM

## 2023-01-26 ENCOUNTER — Other Ambulatory Visit: Payer: Medicare PPO

## 2023-05-24 DIAGNOSIS — E785 Hyperlipidemia, unspecified: Secondary | ICD-10-CM | POA: Diagnosis not present

## 2023-05-24 DIAGNOSIS — Z809 Family history of malignant neoplasm, unspecified: Secondary | ICD-10-CM | POA: Diagnosis not present

## 2023-05-24 DIAGNOSIS — J449 Chronic obstructive pulmonary disease, unspecified: Secondary | ICD-10-CM | POA: Diagnosis not present

## 2023-05-24 DIAGNOSIS — M62838 Other muscle spasm: Secondary | ICD-10-CM | POA: Diagnosis not present

## 2023-05-24 DIAGNOSIS — R32 Unspecified urinary incontinence: Secondary | ICD-10-CM | POA: Diagnosis not present

## 2023-05-24 DIAGNOSIS — M545 Low back pain, unspecified: Secondary | ICD-10-CM | POA: Diagnosis not present

## 2023-05-24 DIAGNOSIS — K5900 Constipation, unspecified: Secondary | ICD-10-CM | POA: Diagnosis not present

## 2023-05-24 DIAGNOSIS — G629 Polyneuropathy, unspecified: Secondary | ICD-10-CM | POA: Diagnosis not present

## 2023-05-24 DIAGNOSIS — M1990 Unspecified osteoarthritis, unspecified site: Secondary | ICD-10-CM | POA: Diagnosis not present

## 2023-06-21 ENCOUNTER — Ambulatory Visit: Payer: Medicare PPO | Admitting: Family Medicine

## 2023-06-21 ENCOUNTER — Encounter: Payer: Self-pay | Admitting: Family Medicine

## 2023-06-21 VITALS — HR 69 | Temp 98.1°F | Ht 75.0 in | Wt 143.2 lb

## 2023-06-21 DIAGNOSIS — R6881 Early satiety: Secondary | ICD-10-CM

## 2023-06-21 NOTE — Progress Notes (Signed)
Subjective:    Patient ID: Bradley Beasley, male    DOB: [birth year], 89 y.o.   MRN: [id number]  HPI Patient reports bloating and early satiety.  He states that he is also battling constipation.  He has tried MiraLAX but has not helped.  After taking MiraLAX he had very small bowel notes that are hard.  He also has to strain to defecat.  He denies any melena.  He denies any hematochezia.  He denies any fevers or chills or nausea or vomiting.  He occasionally has heartburn.  He occasionally has indigestion. Wt Readings from Last 3 Encounters:  06/21/23 143 lb 3.2 oz (65 kg)  01/10/23 144 lb (65.3 kg)  01/03/23 142 lb (64.4 kg)    Past Medical History:  Diagnosis Date   COPD (chronic obstructive pulmonary disease) (HCC)    GERD (gastroesophageal reflux disease)    w/ hiatal hernia   H/O sarcoidosis    History of BPH    History of Helicobacter pylori infection    MVP (mitral valve prolapse)    MV regerg   Personal history of other malignant neoplasm of skin    Tubular adenoma of colon    Past Surgical History:  Procedure Laterality Date   APPENDECTOMY     HERNIA REPAIR     TONSILLECTOMY     Current Outpatient Medications on File Prior to Visit  Medication Sig Dispense Refill   Cyanocobalamin (VITAMIN B 12 PO) Take by mouth.     silver sulfADIAZINE (SILVADENE) 1 % cream Apply 1 Application topically daily. 50 g 0   No current facility-administered medications on file prior to visit.   Allergies  Allergen Reactions   Celebrex [Celecoxib] Nausea And Vomiting   Codeine    Prevacid [Lansoprazole]    Social History   Socioeconomic History   Marital status: Married    Spouse name: Not on file   Number of children: Not on file   Years of education: Not on file   Highest education level: Not on file  Occupational History   Not on file  Tobacco Use   Smoking status: Never   Smokeless tobacco: Never  Vaping Use   Vaping status: Never Used  Substance and Sexual Activity    Alcohol use: No   Drug use: No   Sexual activity: Not Currently  Other Topics Concern   Not on file  Social History Narrative   Not on file   Social Determinants of Health   Financial Resource Strain: Not on file  Food Insecurity: Not on file  Transportation Needs: Not on file  Physical Activity: Not on file  Stress: Not on file  Social Connections: Not on file  Intimate Partner Violence: Not on file   Family History  Problem Relation Age of Onset   Cancer Mother        ovarian   Cancer Father        pancreatic       Review of Systems  All other systems reviewed and are negative.      Objective:   Physical Exam  Constitutional: He is oriented to person, place, and time. He appears well-developed and well-nourished. No distress.  HENT:  Head: Normocephalic and atraumatic.  Right Ear: External ear normal.  Left Ear: External ear normal.  Nose: Nose normal.  Mouth/Throat: Oropharynx is clear and moist. No oropharyngeal exudate.  Eyes: Conjunctivae and EOM are normal. Pupils are equal, round, and reactive to light. Right eye exhibits no  discharge. Left eye exhibits no discharge. No scleral icterus.  Neck: Normal range of motion. Neck supple. No JVD present. No tracheal deviation present. No thyromegaly present.  Cardiovascular: Normal rate, regular rhythm, normal heart sounds and intact distal pulses.  Exam reveals no gallop and no friction rub.   No murmur heard. Pulmonary/Chest: Effort normal and breath sounds normal. No stridor. No respiratory distress. He has no wheezes. He has no rales. He exhibits no tenderness.  Abdominal: Soft. Bowel sounds are normal. He exhibits no distension and no mass. There is no tenderness. There is no rebound and no guarding.  Musculoskeletal: Normal range of motion. He exhibits no edema or tenderness.  Lymphadenopathy:    He has no cervical adenopathy.  Neurological: He is alert and oriented to person, place, and time. He has normal  reflexes. He displays normal reflexes. No cranial nerve deficit. He exhibits normal muscle tone. Coordination normal.  Skin: Skin is warm. No rash noted. He is not diaphoretic. No erythema. No pallor.  Psychiatric: He has a normal mood and affect. His behavior is normal. Judgment and thought content normal.  Vitals reviewed.         Assessment & Plan:   Early satiety - Plan: DG Abd 2 Views Hopefully this is due to constipation and bloating.  Were going to try Linzess 74 to 145 mcg daily.  I gave him samples of both.  This leads to constipation and as result bloating and early sliding removed, no further workup is necessary.  Obtain an x-ray of the abdomen to determine if the patient has physical evidence constipation.  If symptoms or not improving, I will try treating the patient with a proton pump inhibitor for possible gastritis and then recommend an EGD and imaging if not improving

## 2023-06-22 ENCOUNTER — Ambulatory Visit
Admission: RE | Admit: 2023-06-22 | Discharge: 2023-06-22 | Disposition: A | Discharge status: Home / Self Care | Payer: Medicare PPO | Source: Ambulatory Visit | Attending: Family Medicine | Admitting: Family Medicine

## 2023-06-22 DIAGNOSIS — K5900 Constipation, unspecified: Secondary | ICD-10-CM | POA: Diagnosis not present

## 2023-06-22 DIAGNOSIS — R6881 Early satiety: Secondary | ICD-10-CM

## 2023-07-05 ENCOUNTER — Telehealth: Payer: Self-pay | Admitting: Family Medicine

## 2023-07-05 NOTE — Telephone Encounter (Signed)
Patient's daughter Marylene Land called to request xray results.   Please advise at [phone].

## 2023-08-18 ENCOUNTER — Telehealth: Payer: Self-pay | Admitting: Family Medicine

## 2023-08-18 ENCOUNTER — Other Ambulatory Visit: Payer: Self-pay | Admitting: Family Medicine

## 2023-08-18 MED ORDER — LINACLOTIDE 290 MCG PO CAPS: 290.0000 ug | ORAL_CAPSULE | Freq: Every day | ORAL | 11 refills | Status: DC

## 2023-08-18 NOTE — Telephone Encounter (Signed)
Patient called to report he's doing well with the samples he received of Linzess; requesting for provider to send a script to his pharmacy:  [id number] [place], [place] - [address] [place] [address] Phone: [phone]  Fax: [phone] DEA #: [id number]    Please advise patient at [phone], or [phone].

## 2023-08-18 NOTE — Telephone Encounter (Signed)
Spoke with patient and he stated that he is taking 290 mcg of the Linzess and it is working just fine. [facility] [address]

## 2023-08-24 DIAGNOSIS — Z85828 Personal history of other malignant neoplasm of skin: Secondary | ICD-10-CM | POA: Diagnosis not present

## 2023-08-24 DIAGNOSIS — L821 Other seborrheic keratosis: Secondary | ICD-10-CM | POA: Diagnosis not present

## 2023-08-24 DIAGNOSIS — C44519 Basal cell carcinoma of skin of other part of trunk: Secondary | ICD-10-CM | POA: Diagnosis not present

## 2023-08-24 DIAGNOSIS — D485 Neoplasm of uncertain behavior of skin: Secondary | ICD-10-CM | POA: Diagnosis not present

## 2023-08-24 DIAGNOSIS — D0462 Carcinoma in situ of skin of left upper limb, including shoulder: Secondary | ICD-10-CM | POA: Diagnosis not present

## 2023-08-24 DIAGNOSIS — L570 Actinic keratosis: Secondary | ICD-10-CM | POA: Diagnosis not present

## 2023-09-09 ENCOUNTER — Ambulatory Visit (INDEPENDENT_AMBULATORY_CARE_PROVIDER_SITE_OTHER): Payer: Medicare PPO

## 2023-09-09 DIAGNOSIS — Z23 Encounter for immunization: Secondary | ICD-10-CM

## 2023-10-26 DIAGNOSIS — H52203 Unspecified astigmatism, bilateral: Secondary | ICD-10-CM | POA: Diagnosis not present

## 2023-10-26 DIAGNOSIS — Z961 Presence of intraocular lens: Secondary | ICD-10-CM | POA: Diagnosis not present

## 2023-10-26 DIAGNOSIS — H26493 Other secondary cataract, bilateral: Secondary | ICD-10-CM | POA: Diagnosis not present

## 2023-10-31 ENCOUNTER — Other Ambulatory Visit: Payer: Self-pay | Admitting: Family Medicine

## 2023-11-01 NOTE — Telephone Encounter (Signed)
Requested medication (s) are due for refill today-unsure  Requested medication (s) are on the active medication list -yes  Future visit scheduled -yes  Last refill: 01/03/23 50g  Notes to clinic: off protocol- provider review   Requested Prescriptions  Pending Prescriptions Disp Refills   silver sulfADIAZINE (SILVADENE) 1 % cream [Pharmacy Med Name: SILVER SULFADIAZINE 1% CREAM] 50 g 0    Sig: Apply 1 Application topically daily.     Off-Protocol Failed - 10/31/2023  9:25 AM      Failed - Medication not assigned to a protocol, review manually.      Failed - Valid encounter within last 12 months    Recent Outpatient Visits           1 year ago Screening cholesterol level   [facility] Pickard, Priscille Heidelberg, MD   2 years ago Myalgia   [facility] Tanya Nones, Priscille Heidelberg, MD   3 years ago Chronic fatigue   [facility] Tanya Nones, Priscille Heidelberg, MD   3 years ago Leg pain, bilateral   [facility] Donita Brooks, MD   4 years ago SOB (shortness of breath)   [facility] Pickard, Priscille Heidelberg, MD       Future Appointments             In 2 weeks Pickard, Priscille Heidelberg, MD [facility], [facility]               Requested Prescriptions  Pending Prescriptions Disp Refills   silver sulfADIAZINE (SILVADENE) 1 % cream [Pharmacy Med Name: SILVER SULFADIAZINE 1% CREAM] 50 g 0    Sig: Apply 1 Application topically daily.     Off-Protocol Failed - 10/31/2023  9:25 AM      Failed - Medication not assigned to a protocol, review manually.      Failed - Valid encounter within last 12 months    Recent Outpatient Visits           1 year ago Screening cholesterol level   [facility] Pickard, Priscille Heidelberg, MD   2 years ago Myalgia   [facility] Tanya Nones, Priscille Heidelberg, MD   3 years ago Chronic fatigue   [facility] Tanya Nones, Priscille Heidelberg, MD   3 years ago Leg  pain, bilateral   [facility] Donita Brooks, MD   4 years ago SOB (shortness of breath)   [facility] Pickard, Priscille Heidelberg, MD       Future Appointments             In 2 weeks Pickard, Priscille Heidelberg, MD [facility], [facility]

## 2023-11-15 ENCOUNTER — Telehealth: Payer: Self-pay | Admitting: Cardiovascular Disease

## 2023-11-15 ENCOUNTER — Encounter: Payer: Self-pay | Admitting: Family Medicine

## 2023-11-15 ENCOUNTER — Ambulatory Visit (INDEPENDENT_AMBULATORY_CARE_PROVIDER_SITE_OTHER): Payer: Medicare PPO | Admitting: Family Medicine

## 2023-11-15 VITALS — BP 116/70 | HR 82 | Temp 98.3°F | Ht 75.0 in | Wt 144.2 lb

## 2023-11-15 DIAGNOSIS — R002 Palpitations: Secondary | ICD-10-CM

## 2023-11-15 DIAGNOSIS — I441 Atrioventricular block, second degree: Secondary | ICD-10-CM

## 2023-11-15 DIAGNOSIS — Z136 Encounter for screening for cardiovascular disorders: Secondary | ICD-10-CM | POA: Diagnosis not present

## 2023-11-15 DIAGNOSIS — Z0001 Encounter for general adult medical examination with abnormal findings: Secondary | ICD-10-CM

## 2023-11-15 NOTE — Progress Notes (Addendum)
Subjective:    Patient ID: Bradley Beasley, male    DOB: 07/08/1933, 89 y.o.   MRN: [id number]  HPI Patient is a very pleasant 89 year old Caucasian gentleman who presents today for complete physical exam.  He has a remote history of sarcoidosis but has been in remission for decades.  He is asymptomatic today.  He denies any syncope or near syncope or chest pain or lightheadedness.  However, as I did his physical exam, I noticed that his heart rate was slightly irregular.  This prompted me to do an EKG.  On EKG, the patient has a gradually enlarging PR interval consistent with a second-degree AV block Mobitz type II.  He also has what appears to be a right bundle branch block.  This is a new finding for the patient. Past Medical History:  Diagnosis Date   COPD (chronic obstructive pulmonary disease) (HCC)    GERD (gastroesophageal reflux disease)    w/ hiatal hernia   H/O sarcoidosis    History of BPH    History of Helicobacter pylori infection    MVP (mitral valve prolapse)    MV regerg   Personal history of other malignant neoplasm of skin    Tubular adenoma of colon    Past Surgical History:  Procedure Laterality Date   APPENDECTOMY     HERNIA REPAIR     TONSILLECTOMY     Current Outpatient Medications on File Prior to Visit  Medication Sig Dispense Refill   Cyanocobalamin (VITAMIN B 12 PO) Take by mouth.     linaclotide (LINZESS) 290 MCG CAPS capsule Take 1 capsule (290 mcg total) by mouth daily before breakfast. 30 capsule 11   silver sulfADIAZINE (SILVADENE) 1 % cream Apply 1 Application topically daily. 50 g 0   No current facility-administered medications on file prior to visit.   Allergies  Allergen Reactions   Celebrex [Celecoxib] Nausea And Vomiting   Codeine    Prevacid [Lansoprazole]    Social History   Socioeconomic History   Marital status: Married    Spouse name: Not on file   Number of children: Not on file   Years of education: Not on file   Highest  education level: Not on file  Occupational History   Not on file  Tobacco Use   Smoking status: Never   Smokeless tobacco: Never  Vaping Use   Vaping status: Never Used  Substance and Sexual Activity   Alcohol use: No   Drug use: No   Sexual activity: Not Currently  Other Topics Concern   Not on file  Social History Narrative   Not on file   Social Determinants of Health   Financial Resource Strain: Not on file  Food Insecurity: Not on file  Transportation Needs: Not on file  Physical Activity: Not on file  Stress: Not on file  Social Connections: Not on file  Intimate Partner Violence: Not on file   Family History  Problem Relation Age of Onset   Cancer Mother        ovarian   Cancer Father        pancreatic       Review of Systems  All other systems reviewed and are negative.      Objective:   Physical Exam  Constitutional: He is oriented to person, place, and time. He appears well-developed and well-nourished. No distress.  HENT:  Head: Normocephalic and atraumatic.  Right Ear: External ear normal.  Left Ear: External ear normal.  Nose: Nose normal.  Mouth/Throat: Oropharynx is clear and moist. No oropharyngeal exudate.  Eyes: Conjunctivae and EOM are normal. Pupils are equal, round, and reactive to light. Right eye exhibits no discharge. Left eye exhibits no discharge. No scleral icterus.  Neck: Normal range of motion. Neck supple. No JVD present. No tracheal deviation present. No thyromegaly present.  Cardiovascular: Normal rate, regular rhythm, normal heart sounds and intact distal pulses.  Exam reveals no gallop and no friction rub.   No murmur heard. Pulmonary/Chest: Effort normal and breath sounds normal. No stridor. No respiratory distress. He has no wheezes. He has no rales. He exhibits no tenderness.  Abdominal: Soft. Bowel sounds are normal. He exhibits no distension and no mass. There is no tenderness. There is no rebound and no guarding.   Musculoskeletal: Normal range of motion. He exhibits no edema or tenderness.  Lymphadenopathy:    He has no cervical adenopathy.  Neurological: He is alert and oriented to person, place, and time. He has normal reflexes. He displays normal reflexes. No cranial nerve deficit. He exhibits normal muscle tone. Coordination normal.  Skin: Skin is warm. No rash noted. He is not diaphoretic. No erythema. No pallor.  Psychiatric: He has a normal mood and affect. His behavior is normal. Judgment and thought content normal.  Vitals reviewed.         Assessment & Plan:   Palpitations - Plan: EKG 12-Lead  Mobitz type 1 second degree AV block - Plan: Ambulatory referral to Cardiology, CBC with Differential/Platelet, COMPLETE METABOLIC PANEL WITH GFR, Lipid panel, TSH  Today is completely asymptomatic.  However his physical exam reveals a type 1 second-degree AV block.  Therefore I will refer the patient to cardiology urgently.  We discussed warning symptoms to go to the hospital such as syncope, lightheadedness, near syncope.  I will check a CBC, CMP, lipid panel and a TSH.  I am uncertain whether this could be related to his sarcoidosis.  Otherwise he is asymptomatic.  Will defer further workup to cardiology regarding a PET scan if they feel necessary to evaluate for sarcoidosis as a potential cause.

## 2023-11-15 NOTE — Telephone Encounter (Signed)
Office calling to speak with the [facility]. Please advise

## 2023-11-15 NOTE — Telephone Encounter (Signed)
Dr. Lynnea Ferrier on the phone with his nurse Germaine Pomfret. Dr. Tanya Nones spoke with Dr. Allyson Sabal (DOD) regarding abnormal EKG. Pt is currently in sinus rhythm with mobitz type II with a rate of 62. Pt is asymptomatic at this time. Dr. Allyson Sabal recommends following up with cardiology on an outpatient basis. Dr. Tanya Nones with make referral.

## 2023-11-16 LAB — CBC WITH DIFFERENTIAL/PLATELET
Absolute Lymphocytes: 1276 {cells}/uL (ref 850–3900)
Absolute Monocytes: 737 {cells}/uL (ref 200–950)
Basophils Absolute: 41 {cells}/uL (ref 0–200)
Basophils Relative: 0.7 %
Eosinophils Absolute: 151 {cells}/uL (ref 15–500)
Eosinophils Relative: 2.6 %
HCT: 46.2 % (ref 38.5–50.0)
Hemoglobin: 15.3 g/dL (ref 13.2–17.1)
MCH: 31.8 pg (ref 27.0–33.0)
MCHC: 33.1 g/dL (ref 32.0–36.0)
MCV: 96 fL (ref 80.0–100.0)
MPV: 11 fL (ref 7.5–12.5)
Monocytes Relative: 12.7 %
Neutro Abs: 3596 {cells}/uL (ref 1500–7800)
Neutrophils Relative %: 62 %
Platelets: 176 10*3/uL (ref 140–400)
RBC: 4.81 10*6/uL (ref 4.20–5.80)
RDW: 12.7 % (ref 11.0–15.0)
Total Lymphocyte: 22 %
WBC: 5.8 10*3/uL (ref 3.8–10.8)

## 2023-11-16 LAB — LIPID PANEL
Cholesterol: 172 mg/dL (ref <200)
HDL: 64 mg/dL (ref >=40)
LDL Cholesterol (Calc): 88 mg/dL
Non-HDL Cholesterol (Calc): 108 mg/dL (ref <130)
Total CHOL/HDL Ratio: 2.7 (calc) (ref <5.0)
Triglycerides: 103 mg/dL (ref <150)

## 2023-11-16 LAB — COMPLETE METABOLIC PANEL WITH GFR
AG Ratio: 1.8 (calc) (ref 1.0–2.5)
ALT: 12 U/L (ref 9–46)
AST: 20 U/L (ref 10–35)
Albumin: 4.4 g/dL (ref 3.6–5.1)
Alkaline phosphatase (APISO): 67 U/L (ref 35–144)
BUN: 16 mg/dL (ref 7–25)
CO2: 30 mmol/L (ref 20–32)
Calcium: 9.6 mg/dL (ref 8.6–10.3)
Chloride: 101 mmol/L (ref 98–110)
Creat: 1.06 mg/dL (ref 0.70–1.22)
Globulin: 2.4 g/dL (ref 1.9–3.7)
Glucose, Bld: 96 mg/dL (ref 65–99)
Potassium: 4.2 mmol/L (ref 3.5–5.3)
Sodium: 140 mmol/L (ref 135–146)
Total Bilirubin: 0.8 mg/dL (ref 0.2–1.2)
Total Protein: 6.8 g/dL (ref 6.1–8.1)
eGFR: 68 mL/min/{1.73_m2} (ref >=60)

## 2023-11-16 LAB — TSH: TSH: 2.78 m[IU]/L (ref 0.40–4.50)

## 2023-11-17 ENCOUNTER — Telehealth: Payer: Self-pay

## 2023-11-17 NOTE — Telephone Encounter (Signed)
Copied from CRM [id number]. Topic: Clinical - Medical Advice >> Nov 17, 2023 11:11 AM Orinda Kenner C wrote: Reason for CRM: Pt's dtr Marylene Land [phone], pls c/b. Pt is scheduled 01/20/24 with Dr. Kristeen Miss, is that appt soon enough? Also, a representative at Dr. Melburn Popper rec pt to see an Electrophysiology [facility] Dr. Graciela Husbands or Dr. Lalla Brothers, if pt needs a pacemaker. Do they need a different referral? Marylene Land wants Dr. Clifton Custard to advise and c/b.

## 2023-11-17 NOTE — Telephone Encounter (Signed)
Copied from CRM [id number]. Topic: General - Other >> Nov 17, 2023  1:12 PM Sasha H wrote: Reason for CRM: pt states he was returning a call and would like a call back

## 2023-11-21 ENCOUNTER — Telehealth: Payer: Self-pay | Admitting: Family Medicine

## 2023-11-21 NOTE — Telephone Encounter (Signed)
Copied from CRM [id number]. Topic: Referral - Question >> Nov 18, 2023  4:35 PM Fuller Mandril wrote: Reason for CRM: Pt daughter called back in. Her father was expecting call back from [facility]. Per notes call and message documented and sent to provider. Caller wants  to know if appt with Dr. Kristeen Miss soon enough and if referral need for an Electrophysiology [facility] Dr. Graciela Husbands or Dr. Lalla Brothers appt required by provider, if pt needs a pacemaker.

## 2024-01-17 NOTE — Progress Notes (Unsigned)
  Cardiology Office Note:  .   Date:  01/20/2024  ID:  Bradley Beasley, DOB [birth year], MRN [id number] PCP: Donita Brooks, MD  [facility] Providers Cardiologist:  None {  History of Present Illness: .    01/20/24  Bradley Beasley is a 89 y.o. male with hx of Mobitz I AV block  I met him many years ago   Was recently found to have Mobitz I AV block  No episodes of syncope Still has a large garden  Plays golf occasional       ROS:   Studies Reviewed: .         Risk Assessment/Calculations:             Physical Exam:   VS:  BP 138/68 (BP Location: Left Arm, Patient Position: Sitting, Cuff Size: Normal)   Pulse 68   Ht 6\' 3"  (1.905 m)   Wt 146 lb 9.6 oz (66.5 kg)   SpO2 98%   BMI 18.32 kg/m    Wt Readings from Last 3 Encounters:  01/20/24 146 lb 9.6 oz (66.5 kg)  11/15/23 144 lb 4 oz (65.4 kg)  06/21/23 143 lb 3.2 oz (65 kg)    GEN: Well nourished, well developed in no acute distress NECK: No JVD; No carotid bruits CARDIAC: RRR,  soft systolic murmur  RESPIRATORY:  Clear to auscultation without rales, wheezing or rhonchi  ABDOMEN: Soft, non-tender, non-distended EXTREMITIES:  No edema; No deformity   ASSESSMENT AND PLAN: .   Mobitz type I AV block: Patient has Mobitz type I AV block.  This is benign.  He is not on any AV nodal blocking agents.  He is incredibly healthy for 89 years old.  Will continue to follow him on a yearly basis.  He will let us know if he has any episodes of syncope or presyncope.       Dispo: 1 year   Signed, Kristeen Miss, MD

## 2024-01-20 ENCOUNTER — Ambulatory Visit: Payer: Medicare PPO | Attending: Cardiovascular Disease | Admitting: Cardiovascular Disease

## 2024-01-20 ENCOUNTER — Encounter: Payer: Self-pay | Admitting: Cardiovascular Disease

## 2024-01-20 VITALS — BP 138/68 | HR 68 | Ht 75.0 in | Wt 146.6 lb

## 2024-01-20 DIAGNOSIS — I441 Atrioventricular block, second degree: Secondary | ICD-10-CM | POA: Diagnosis not present

## 2024-01-20 NOTE — Patient Instructions (Signed)
Follow-Up: At [facility], you and your health needs are our priority.  As part of our continuing mission to provide you with exceptional heart care, we have created designated Provider Care Teams.  These Care Teams include your primary Cardiologist (physician) and Advanced Practice Providers (APPs -  Physician Assistants and Nurse Practitioners) who all work together to provide you with the care you need, when you need it.  We recommend signing up for the patient portal called "MyChart".  Sign up information is provided on this After Visit Summary.  MyChart is used to connect with patients for Virtual Visits (Telemedicine).  Patients are able to view lab/test results, encounter notes, upcoming appointments, etc.  Non-urgent messages can be sent to your provider as well.   To learn more about what you can do with MyChart, go to [email or web address].    Your next appointment:   1 year(s)  Provider:   Provider  Other Instructions   1st Floor: - Lobby - Registration  - Pharmacy  - Lab - Cafe  2nd Floor: - PV Lab - Diagnostic Testing (echo, CT, nuclear med)  3rd Floor: - Vacant  4th Floor: - TCTS (cardiothoracic surgery) - AFib Clinic - Structural Heart Clinic - Vascular Surgery  - Vascular Ultrasound  5th Floor: - HeartCare Cardiology (general and EP) - Clinical Pharmacy for coumadin, hypertension, lipid, weight-loss medications, and med management appointments    Valet parking services will be available as well.

## 2024-03-19 ENCOUNTER — Ambulatory Visit: Admitting: Family Medicine

## 2024-03-19 ENCOUNTER — Encounter: Payer: Self-pay | Admitting: Family Medicine

## 2024-03-19 VITALS — BP 114/68 | HR 80 | Temp 97.8°F | Ht 75.0 in | Wt 144.8 lb

## 2024-03-19 DIAGNOSIS — G8929 Other chronic pain: Secondary | ICD-10-CM

## 2024-03-19 DIAGNOSIS — M25562 Pain in left knee: Secondary | ICD-10-CM | POA: Diagnosis not present

## 2024-03-19 MED ORDER — MELOXICAM 15 MG PO TABS: 15.0000 mg | ORAL_TABLET | Freq: Every day | ORAL | 3 refills | Status: AC

## 2024-03-19 NOTE — Progress Notes (Signed)
 Subjective:    Patient ID: Bradley Beasley, male    DOB: 18-Aug-1933, 89 y.o.   MRN: [id number]  HPI Patient is a very pleasant 89 year old Caucasian gentleman who presents today 89 complaining of left knee pain.  He has osteoarthritis in the left knee.  I gave him an injection in his knee about a year ago that provided him substantial relief.  He states that he has stiffness every morning when he wakes up.  It hurts to stand and walk.  It hurts to stoop or bend over.  He denies any laxity to varus or valgus stress.  He denies any locking.   Past Medical History:  Diagnosis Date   COPD (chronic obstructive pulmonary disease) (HCC)    GERD (gastroesophageal reflux disease)    w/ hiatal hernia   H/O sarcoidosis    History of BPH    History of Helicobacter pylori infection    MVP (mitral valve prolapse)    MV regerg   Personal history of other malignant neoplasm of skin    Tubular adenoma of colon    Past Surgical History:  Procedure Laterality Date   APPENDECTOMY     HERNIA REPAIR     TONSILLECTOMY     Current Outpatient Medications on File Prior to Visit  Medication Sig Dispense Refill   Cyanocobalamin (VITAMIN B 12 PO) Take by mouth.     No current facility-administered medications on file prior to visit.   Allergies  Allergen Reactions   Celebrex [Celecoxib] Nausea And Vomiting   Codeine    Prevacid [Lansoprazole]    Social History   Socioeconomic History   Marital status: Married    Spouse name: Not on file   Number of children: Not on file   Years of education: Not on file   Highest education level: Not on file  Occupational History   Not on file  Tobacco Use   Smoking status: Never   Smokeless tobacco: Never  Vaping Use   Vaping status: Never Used  Substance and Sexual Activity   Alcohol use: No   Drug use: No   Sexual activity: Not Currently  Other Topics Concern   Not on file  Social History Narrative   Not on file   Social Drivers of Health    Financial Resource Strain: Not on file  Food Insecurity: Not on file  Transportation Needs: Not on file  Physical Activity: Not on file  Stress: Not on file  Social Connections: Not on file  Intimate Partner Violence: Not on file   Family History  Problem Relation Age of Onset   Cancer Mother        ovarian   Cancer Father        pancreatic       Review of Systems  All other systems reviewed and are negative.      Objective:   Physical Exam  Constitutional: He is oriented to person, place, and time. He appears well-developed and well-nourished. No distress.  HENT:  Head: Normocephalic and atraumatic.   Cardiovascular: Normal rate, regular rhythm, normal heart sounds and intact distal pulses.  Exam reveals no gallop and no friction rub.   No murmur heard. Pulmonary/Chest: Effort normal and breath sounds normal. No stridor. No respiratory distress. He has no wheezes. He has no rales. He exhibits no tenderness.  Abdominal: Soft. Bowel sounds are normal. He exhibits no distension and no mass. There is no tenderness. There is no rebound and no guarding.  Musculoskeletal:  Normal range of motion. He exhibits no edema or tenderness.    Left knee shows no effusion or erythema.  He has normal range of motion.  He does have some mild tenderness underneath the patella and on the medial and lateral joint line to pressure     Assessment & Plan:   Chronic pain of left knee I suspect his knee pain is due to osteoarthritis.  Using sterile technique, I injected the left knee with 2 cc of lidocaine, 2 cc of Marcaine, and 2 cc of 40 mg/mL Kenalog.  The patient tolerated the procedure well.  Begin meloxicam 15 mg p.o. daily as needed knee pain

## 2024-03-20 ENCOUNTER — Ambulatory Visit

## 2024-03-26 MED ORDER — TRIAMCINOLONE ACETONIDE 40 MG/ML IJ SUSP
40.0000 mg | Freq: Once | INTRAMUSCULAR | Status: AC
  Administered 2024-03-19: 40 mg via INTRAMUSCULAR

## 2024-03-26 NOTE — Addendum Note (Signed)
 Addended by: Gillermo Lack K on: 03/26/2024 02:18 PM   Modules accepted: Orders

## 2024-05-22 ENCOUNTER — Ambulatory Visit: Admitting: Family Medicine

## 2024-05-22 ENCOUNTER — Encounter: Payer: Self-pay | Admitting: Family Medicine

## 2024-05-22 VITALS — BP 108/60 | HR 80 | Ht 75.0 in | Wt 142.8 lb

## 2024-05-22 DIAGNOSIS — N410 Acute prostatitis: Secondary | ICD-10-CM

## 2024-05-22 DIAGNOSIS — N289 Disorder of kidney and ureter, unspecified: Secondary | ICD-10-CM

## 2024-05-22 DIAGNOSIS — Z85828 Personal history of other malignant neoplasm of skin: Secondary | ICD-10-CM | POA: Diagnosis not present

## 2024-05-22 LAB — URINALYSIS, ROUTINE W REFLEX MICROSCOPIC
Bilirubin Urine: NEGATIVE
Glucose, UA: NEGATIVE
Hgb urine dipstick: NEGATIVE
Ketones, ur: NEGATIVE
Leukocytes,Ua: NEGATIVE
Nitrite: NEGATIVE
Protein, ur: NEGATIVE
Specific Gravity, Urine: 1.005 (ref 1.001–1.035)
pH: 6 (ref 5.0–8.0)

## 2024-05-22 MED ORDER — TAMSULOSIN HCL 0.4 MG PO CAPS: 0.4000 mg | ORAL_CAPSULE | Freq: Every day | ORAL | 3 refills | Status: DC

## 2024-05-22 MED ORDER — SULFAMETHOXAZOLE-TRIMETHOPRIM 800-160 MG PO TABS
1.0000 | ORAL_TABLET | Freq: Two times a day (BID) | ORAL | 0 refills | Status: DC
Start: 2024-05-22 — End: 2024-05-31

## 2024-05-22 NOTE — Progress Notes (Signed)
 Subjective:    Patient ID: Bradley Beasley, male    DOB: 03/24/1933, 89 y.o.   MRN: [id number]  HPI Patient is a very pleasant 89 year old Caucasian gentleman who presents today reporting increasing urinary frequency.  He states that over the last 4 weeks, he finds himself having to pee more often.  He states that he is also having to pee for a longer time.  He denies any dysuria.  He denies any hesitancy.  He denies any hematuria.  He denies any fevers but he does report feeling weak and tired.  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  Has been very humid recently and she is concerned that he is not drinking enough fluid.  He denies any rectal pain.  Urinalysis today shows no glucose, negative blood, negative protein, negative nitrates, negative leukocyte esterase.  However on exam, he has a 2+ prostate however it is not tender to palpation  Past Medical History:  Diagnosis Date   COPD (chronic obstructive pulmonary disease) (HCC)    GERD (gastroesophageal reflux disease)    w/ hiatal hernia   H/O sarcoidosis    History of BPH    History of Helicobacter pylori infection    MVP (mitral valve prolapse)    MV regerg   Personal history of other malignant neoplasm of skin    Tubular adenoma of colon    Past Surgical History:  Procedure Laterality Date   APPENDECTOMY     HERNIA REPAIR     TONSILLECTOMY     Current Outpatient Medications on File Prior to Visit  Medication Sig Dispense Refill   Cyanocobalamin  (VITAMIN B 12 PO) Take by mouth.     meloxicam  (MOBIC ) 15 MG tablet Take 1 tablet (15 mg total) by mouth daily. 30 tablet 3   No current facility-administered medications on file prior to visit.   Allergies  Allergen Reactions   Celebrex [Celecoxib] Nausea And Vomiting   Codeine    Prevacid [Lansoprazole]    Social History   Socioeconomic History   Marital status: Married    Spouse name: Not on file   Number of children: Not on  file   Years of education: Not on file   Highest education level: Not on file  Occupational History   Not on file  Tobacco Use   Smoking status: Never   Smokeless tobacco: Never  Vaping Use   Vaping status: Never Used  Substance and Sexual Activity   Alcohol use: No   Drug use: No   Sexual activity: Not Currently  Other Topics Concern   Not on file  Social History Narrative   Not on file   Social Drivers of Health   Financial Resource Strain: Not on file  Food Insecurity: Not on file  Transportation Needs: Not on file  Physical Activity: Not on file  Stress: Not on file  Social Connections: Not on file  Intimate Partner Violence: Not on file   Family History  Problem Relation Age of Onset   Cancer Mother        ovarian   Cancer Father        pancreatic       Review of Systems  All other systems reviewed and are negative.      Objective:   Physical Exam  Constitutional: He is oriented to person, place, and time. He appears well-developed and well-nourished. No distress.  HENT:  Head: Normocephalic and atraumatic.  Cardiovascular: Normal rate, regular rhythm, normal heart sounds and intact distal pulses.  Exam reveals no gallop and no friction rub.   No murmur heard. Pulmonary/Chest: Effort normal and breath sounds normal. No stridor. No respiratory distress. He has no wheezes. He has no rales. He exhibits no tenderness.  Abdominal: Soft. Bowel sounds are normal. He exhibits no distension and no mass. There is no tenderness. There is no rebound and no guarding.  Musculoskeletal: Normal range of motion. He exhibits no edema or tenderness.    Patient has an enlarged prostate.  It appears swollen soft and boggy to palpation.  There is no nodularity.  Patient denies any tenderness. Assessment & Plan:   Kidney problem - Plan: Urinalysis, Routine w reflex microscopic  Acute prostatitis - Plan: Comprehensive metabolic panel with GFR, CBC with  Differential/Platelet, PSA I think the patient has prostatitis with BPH.  I believe this is causing urinary retention and increasing urinary frequency.  I recommended trying Flomax 0.4 mg daily coupled with Bactrim double strength tablets twice daily and recheck in 1 week.  Meanwhile check PSA.  In 1 week, we will determine if we need to extend antibiotics for an additional week.  Also on the differential diagnosis would be overactive bladder.

## 2024-05-23 LAB — CBC WITH DIFFERENTIAL/PLATELET
Absolute Lymphocytes: 1482 {cells}/uL (ref 850–3900)
Absolute Monocytes: 713 {cells}/uL (ref 200–950)
Basophils Absolute: 51 {cells}/uL (ref 0–200)
Basophils Relative: 0.9 %
Eosinophils Absolute: 171 {cells}/uL (ref 15–500)
Eosinophils Relative: 3 %
HCT: 45.6 % (ref 38.5–50.0)
Hemoglobin: 14.8 g/dL (ref 13.2–17.1)
MCH: 32 pg (ref 27.0–33.0)
MCHC: 32.5 g/dL (ref 32.0–36.0)
MCV: 98.5 fL (ref 80.0–100.0)
MPV: 10.8 fL (ref 7.5–12.5)
Monocytes Relative: 12.5 %
Neutro Abs: 3283 {cells}/uL (ref 1500–7800)
Neutrophils Relative %: 57.6 %
Platelets: 173 10*3/uL (ref 140–400)
RBC: 4.63 10*6/uL (ref 4.20–5.80)
RDW: 13.2 % (ref 11.0–15.0)
Total Lymphocyte: 26 %
WBC: 5.7 10*3/uL (ref 3.8–10.8)

## 2024-05-23 LAB — COMPREHENSIVE METABOLIC PANEL WITH GFR
AG Ratio: 1.9 (calc) (ref 1.0–2.5)
ALT: 17 U/L (ref 9–46)
AST: 22 U/L (ref 10–35)
Albumin: 4.4 g/dL (ref 3.6–5.1)
Alkaline phosphatase (APISO): 55 U/L (ref 35–144)
BUN: 22 mg/dL (ref 7–25)
CO2: 30 mmol/L (ref 20–32)
Calcium: 9.8 mg/dL (ref 8.6–10.3)
Chloride: 102 mmol/L (ref 98–110)
Creat: 0.92 mg/dL (ref 0.70–1.22)
Globulin: 2.3 g/dL (ref 1.9–3.7)
Glucose, Bld: 117 mg/dL — ABNORMAL HIGH (ref 65–99)
Potassium: 4.3 mmol/L (ref 3.5–5.3)
Sodium: 141 mmol/L (ref 135–146)
Total Bilirubin: 0.6 mg/dL (ref 0.2–1.2)
Total Protein: 6.7 g/dL (ref 6.1–8.1)
eGFR: 80 mL/min/{1.73_m2} (ref >=60)

## 2024-05-23 LAB — PSA: PSA: 0.88 ng/mL (ref <=4.00)

## 2024-05-24 ENCOUNTER — Ambulatory Visit: Payer: Self-pay | Admitting: Family Medicine

## 2024-05-31 ENCOUNTER — Encounter: Payer: Self-pay | Admitting: Family Medicine

## 2024-05-31 ENCOUNTER — Other Ambulatory Visit: Payer: Self-pay | Admitting: Family Medicine

## 2024-05-31 ENCOUNTER — Ambulatory Visit: Admitting: Family Medicine

## 2024-05-31 VITALS — BP 120/65 | HR 78 | Temp 97.7°F | Ht 75.0 in | Wt 140.0 lb

## 2024-05-31 DIAGNOSIS — N401 Enlarged prostate with lower urinary tract symptoms: Secondary | ICD-10-CM

## 2024-05-31 DIAGNOSIS — Z23 Encounter for immunization: Secondary | ICD-10-CM | POA: Diagnosis not present

## 2024-05-31 DIAGNOSIS — S61411A Laceration without foreign body of right hand, initial encounter: Secondary | ICD-10-CM

## 2024-05-31 DIAGNOSIS — R350 Frequency of micturition: Secondary | ICD-10-CM

## 2024-05-31 MED ORDER — CEPHALEXIN 500 MG PO CAPS: 500.0000 mg | ORAL_CAPSULE | Freq: Three times a day (TID) | ORAL | 0 refills | Status: DC

## 2024-05-31 NOTE — Progress Notes (Signed)
 Subjective:    Patient ID: Bradley Beasley, male    DOB: 11-10-1933, 89 y.o.   MRN: [id number]  HPI 05/22/24 Patient is a very pleasant 89 year old Caucasian gentleman who presents today reporting increasing urinary frequency.  He states that over the last 4 weeks, he finds himself having to pee more often.  He states that he is also having to pee for a longer time.  He denies any dysuria.  He denies any hesitancy.  He denies any hematuria.  He denies any fevers but he does report feeling weak and tired.  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  Has been very humid recently and she is concerned that he is not drinking enough fluid.  He denies any rectal pain.  Urinalysis today shows no glucose, negative blood, negative protein, negative nitrates, negative leukocyte esterase.  However on exam, he has a 2+ prostate however it is not tender to palpation.  At that time, my plan was: I think the patient has prostatitis with BPH.  I believe this is causing urinary retention and increasing urinary frequency.  I recommended trying Flomax 0.4 mg daily coupled with Bactrim double strength tablets twice daily and recheck in 1 week.  Meanwhile check PSA.  In 1 week, we will determine if we need to extend antibiotics for an additional week.  Also on the differential diagnosis would be overactive bladder.  05/31/24 Yesterday, the patient sustained a laceration to his right hand.  He suffered a laceration over the dorsal second PIP joint and third PIP joint.  The third PIP joint laceration was closed with 3, 4-0 Prolene sutures.  The second PIP joint laceration required 6, 4-0 Prolene sutures.  He is wearing stack aluminum splints.  He was started on Keflex.  He will need the sutures out in 1 week.  He is due for a tetanus shot.  Patient thinks that his urinary frequency has improved on the tamsulosin.  He has completed the Bactrim.  Past Medical History:  Diagnosis Date    COPD (chronic obstructive pulmonary disease) (HCC)    GERD (gastroesophageal reflux disease)    w/ hiatal hernia   H/O sarcoidosis    History of BPH    History of Helicobacter pylori infection    MVP (mitral valve prolapse)    MV regerg   Personal history of other malignant neoplasm of skin    Tubular adenoma of colon    Past Surgical History:  Procedure Laterality Date   APPENDECTOMY     HERNIA REPAIR     TONSILLECTOMY     Current Outpatient Medications on File Prior to Visit  Medication Sig Dispense Refill   Cyanocobalamin  (VITAMIN B 12 PO) Take by mouth.     meloxicam  (MOBIC ) 15 MG tablet Take 1 tablet (15 mg total) by mouth daily. 30 tablet 3   tamsulosin (FLOMAX) 0.4 MG CAPS capsule Take 1 capsule (0.4 mg total) by mouth daily. 30 capsule 3   No current facility-administered medications on file prior to visit.   Allergies  Allergen Reactions   Celebrex [Celecoxib] Nausea And Vomiting   Codeine    Prevacid [Lansoprazole]    Social History   Socioeconomic History   Marital status: Married    Spouse name: Not on file   Number of children: Not on file   Years of education: Not on file   Highest education level: Not on file  Occupational History  Not on file  Tobacco Use   Smoking status: Never   Smokeless tobacco: Never  Vaping Use   Vaping status: Never Used  Substance and Sexual Activity   Alcohol use: No   Drug use: No   Sexual activity: Not Currently  Other Topics Concern   Not on file  Social History Narrative   Not on file   Social Drivers of Health   Financial Resource Strain: Not on file  Food Insecurity: Not on file  Transportation Needs: Not on file  Physical Activity: Not on file  Stress: Not on file  Social Connections: Not on file  Intimate Partner Violence: Not on file   Family History  Problem Relation Age of Onset   Cancer Mother        ovarian   Cancer Father        pancreatic       Review of Systems  All other systems  reviewed and are negative.      Objective:   Physical Exam  Constitutional: He is oriented to person, place, and time. He appears well-developed and well-nourished. No distress.  HENT:  Head: Normocephalic and atraumatic.   Cardiovascular: Normal rate, regular rhythm, normal heart sounds and intact distal pulses.  Exam reveals no gallop and no friction rub.   No murmur heard. Pulmonary/Chest: Effort normal and breath sounds normal. No stridor. No respiratory distress. He has no wheezes. He has no rales. He exhibits no tenderness.     Laceration on the dorsum of the second PIP and third PIP joints on the right hand.  Patient requires tetanus shot.  Laceration has been closed with sutures as dictated in the history of present illness. Assessment & Plan:   Laceration of right hand without foreign body, initial encounter  Benign prostatic hyperplasia with urinary frequency Patient will need to return in 1 week for suture removal.  Complete Keflex as prescribed.  Wear stack aluminum splints for 1 week to prevent sutures from tearing through the skin.  Tetanus shot administered today.  Continue Flomax 0.4 mg p.o. daily as urinary frequency has improved.

## 2024-06-07 ENCOUNTER — Encounter: Payer: Self-pay | Admitting: Family Medicine

## 2024-06-07 ENCOUNTER — Ambulatory Visit: Admitting: Family Medicine

## 2024-06-07 VITALS — BP 117/80 | HR 68 | Temp 98.0°F | Ht 75.0 in | Wt 144.0 lb

## 2024-06-07 DIAGNOSIS — S61411D Laceration without foreign body of right hand, subsequent encounter: Secondary | ICD-10-CM | POA: Diagnosis not present

## 2024-06-07 DIAGNOSIS — S61411A Laceration without foreign body of right hand, initial encounter: Secondary | ICD-10-CM | POA: Insufficient documentation

## 2024-06-07 NOTE — Progress Notes (Signed)
 Subjective:  HPI: Bradley Beasley is a 89 y.o. male presenting on 06/07/2024 for Acute Visit (Stitch removal from left hand first two fingers /Pt. Stopped taking Flomax  due to issues w/ b/p )   HPI Patient is in today for suture removal. Bradley Beasley has 3 sutures places in his right third finger and 6 sutures placed in his right second finger. Today the lacterations are healing well with no complications. No warmth, redness, drainage, or swelling at the site and he denies fever, chills, body aches. Completed a course of Bactrim  as prescribed. Today his lacerations appear well approximated, sutures removed without complications, no s/s of infection.   05/31/24 HPI for reference only: Yesterday, the patient sustained a laceration to his right hand.  He suffered a laceration over the dorsal second PIP joint and third PIP joint.  The third PIP joint laceration was closed with 3, 4-0 Prolene sutures.  The second PIP joint laceration required 6, 4-0 Prolene sutures.  He is wearing stack aluminum splints.    Suture Removal  Date/Time: 06/07/2024 10:12 AM  Performed by: Kayla Jeoffrey RAMAN, FNP Authorized by: Kayla Jeoffrey S, FNP  Body area: upper extremity Location details: right hand Wound Appearance: clean Sutures Removed: 9 Post-removal: antibiotic ointment applied and dressing applied Facility: sutures placed in this facility Patient tolerance: patient tolerated the procedure well with no immediate complications      Review of Systems  All other systems reviewed and are negative.   Relevant past medical history reviewed and updated as indicated.   Past Medical History:  Diagnosis Date   COPD (chronic obstructive pulmonary disease) (HCC)    GERD (gastroesophageal reflux disease)    w/ hiatal hernia   H/O sarcoidosis    History of BPH    History of Helicobacter pylori infection    MVP (mitral valve prolapse)    MV regerg   Personal history of other malignant neoplasm of skin     Tubular adenoma of colon      Past Surgical History:  Procedure Laterality Date   APPENDECTOMY     HERNIA REPAIR     TONSILLECTOMY      Allergies and medications reviewed and updated.   Current Outpatient Medications:    cephALEXin  (KEFLEX ) 500 MG capsule, Take 1 capsule (500 mg total) by mouth 3 (three) times daily., Disp: 21 capsule, Rfl: 0   Cyanocobalamin  (VITAMIN B 12 PO), Take by mouth., Disp: , Rfl:    meloxicam  (MOBIC ) 15 MG tablet, Take 1 tablet (15 mg total) by mouth daily. (Patient not taking: Reported on 06/07/2024), Disp: 30 tablet, Rfl: 3   tamsulosin  (FLOMAX ) 0.4 MG CAPS capsule, Take 1 capsule (0.4 mg total) by mouth daily. (Patient not taking: Reported on 06/07/2024), Disp: 30 capsule, Rfl: 3  Allergies  Allergen Reactions   Celebrex [Celecoxib] Nausea And Vomiting   Codeine    Prevacid [Lansoprazole]     Objective:   BP 117/80   Pulse 68   Temp 98 F (36.7 C)   Ht 6' 3 (1.905 m)   Wt 144 lb (65.3 kg)   SpO2 99%   BMI 18.00 kg/m      06/07/2024   10:04 AM 05/31/2024    9:15 AM 05/31/2024    9:09 AM  Vitals with BMI  Height 6' 3  6' 3  Weight 144 lbs  140 lbs  BMI 18  17.5  Systolic 117 120 899  Diastolic 80 65 60  Pulse 68 78 77  Physical Exam Vitals and nursing note reviewed.  Constitutional:      Appearance: Normal appearance. He is normal weight.  HENT:     Head: Normocephalic and atraumatic.  Skin:    General: Skin is warm and dry.     Capillary Refill: Capillary refill takes less than 2 seconds.     Findings: Laceration present.      Neurological:     General: No focal deficit present.     Mental Status: He is alert and oriented to person, place, and time. Mental status is at baseline.  Psychiatric:        Mood and Affect: Mood normal.        Behavior: Behavior normal.        Thought Content: Thought content normal.        Judgment: Judgment normal.     Assessment & Plan:  Laceration of right hand without foreign body,  subsequent encounter Assessment & Plan: After removal of sutures there were areas of concern for only partial closure. Wounds were cleansed and steristrips were applied with dry dressing. Splint reapplied to middle finger. Advised on dressing changes and will follow up in 1 week with PCP. No s/s of infection or complications.   Other orders -     Suture Removal     Follow up plan: Return in about 1 week (around 06/14/2024) for follow-up.  Jeoffrey GORMAN Barrio, FNP

## 2024-06-07 NOTE — Assessment & Plan Note (Signed)
 After removal of sutures there were areas of concern for only partial closure. Wounds were cleansed and steristrips were applied with dry dressing. Splint reapplied to middle finger. Advised on dressing changes and will follow up in 1 week with PCP. No s/s of infection or complications.

## 2024-06-15 ENCOUNTER — Encounter: Payer: Self-pay | Admitting: Family Medicine

## 2024-06-15 ENCOUNTER — Ambulatory Visit: Admitting: Family Medicine

## 2024-06-15 VITALS — BP 110/62 | HR 78 | Temp 98.1°F | Ht 75.0 in | Wt 140.8 lb

## 2024-06-15 DIAGNOSIS — S61411D Laceration without foreign body of right hand, subsequent encounter: Secondary | ICD-10-CM

## 2024-06-15 NOTE — Progress Notes (Signed)
 Subjective:    Patient ID: Bradley Beasley, male    DOB: 11/11/1933, 89 y.o.   MRN: [id number]  HPI 05/22/24 Patient is a very pleasant 89 year old Caucasian gentleman who presents today reporting increasing urinary frequency.  He states that over the last 4 weeks, he finds himself having to pee more often.  He states that he is also having to pee for a longer time.  He denies any dysuria.  He denies any hesitancy.  He denies any hematuria.  He denies any fevers but he does report feeling weak and tired.  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  Has been very humid recently and she is concerned that he is not drinking enough fluid.  He denies any rectal pain.  Urinalysis today shows no glucose, negative blood, negative protein, negative nitrates, negative leukocyte esterase.  However on exam, he has a 2+ prostate however it is not tender to palpation.  At that time, my plan was: I think the patient has prostatitis with BPH.  I believe this is causing urinary retention and increasing urinary frequency.  I recommended trying Flomax  0.4 mg daily coupled with Bactrim  double strength tablets twice daily and recheck in 1 week.  Meanwhile check PSA.  In 1 week, we will determine if we need to extend antibiotics for an additional week.  Also on the differential diagnosis would be overactive bladder.  05/31/24 Yesterday, the patient sustained a laceration to his right hand.  He suffered a laceration over the dorsal second PIP joint and third PIP joint.  The third PIP joint laceration was closed with 3, 4-0 Prolene sutures.  The second PIP joint laceration required 6, 4-0 Prolene sutures.  He is wearing stack aluminum splints.  He was started on Keflex .  He will need the sutures out in 1 week.  He is due for a tetanus shot.  Patient thinks that his urinary frequency has improved on the tamsulosin .  He has completed the Bactrim .  At that time, my plan was: Patient will need  to return in 1 week for suture removal.  Complete Keflex  as prescribed.  Wear stack aluminum splints for 1 week to prevent sutures from tearing through the skin.  Tetanus shot administered today.  Continue Flomax  0.4 mg p.o. daily as urinary frequency has improved.  06/15/24  Patient had to discontinue Flomax  due to low blood pressure.  However he feels he has in a long time.  He does note that his right second digit.  Patient has stiffness and is unable to flex the second digit.  Passively I was able to flex the second digit to 70 degrees with minimal pain.  The patient was able to flex the second digit to 60 degrees with minimal pain.  Stiffness is due to wearing a splint.  There is a 2 mm x 4 mm opening where the skin diet after the stitches were removed.  The subcutaneous fascia is exposed.  This will need to heal through secondary intention.  There is no erythema Past Medical History:  Diagnosis Date   COPD (chronic obstructive pulmonary disease) (HCC)    GERD (gastroesophageal reflux disease)    w/ hiatal hernia   H/O sarcoidosis    History of BPH    History of Helicobacter pylori infection    MVP (mitral valve prolapse)    MV regerg   Personal history of other malignant neoplasm of skin    Tubular  adenoma of colon    Past Surgical History:  Procedure Laterality Date   APPENDECTOMY     HERNIA REPAIR     TONSILLECTOMY     Current Outpatient Medications on File Prior to Visit  Medication Sig Dispense Refill   Cyanocobalamin  (VITAMIN B 12 PO) Take by mouth.     cephALEXin  (KEFLEX ) 500 MG capsule Take 1 capsule (500 mg total) by mouth 3 (three) times daily. (Patient not taking: Reported on 06/15/2024) 21 capsule 0   meloxicam  (MOBIC ) 15 MG tablet Take 1 tablet (15 mg total) by mouth daily. (Patient not taking: Reported on 06/15/2024) 30 tablet 3   tamsulosin  (FLOMAX ) 0.4 MG CAPS capsule Take 1 capsule (0.4 mg total) by mouth daily. (Patient not taking: Reported on 06/15/2024) 30 capsule  3   No current facility-administered medications on file prior to visit.   Allergies  Allergen Reactions   Celebrex [Celecoxib] Nausea And Vomiting   Codeine    Prevacid [Lansoprazole]    Social History   Socioeconomic History   Marital status: Married    Spouse name: Not on file   Number of children: Not on file   Years of education: Not on file   Highest education level: Not on file  Occupational History   Not on file  Tobacco Use   Smoking status: Never   Smokeless tobacco: Never  Vaping Use   Vaping status: Never Used  Substance and Sexual Activity   Alcohol use: No   Drug use: No   Sexual activity: Not Currently  Other Topics Concern   Not on file  Social History Narrative   Not on file   Social Drivers of Health   Financial Resource Strain: Not on file  Food Insecurity: Not on file  Transportation Needs: Not on file  Physical Activity: Not on file  Stress: Not on file  Social Connections: Not on file  Intimate Partner Violence: Not on file   Family History  Problem Relation Age of Onset   Cancer Mother        ovarian   Cancer Father        pancreatic       Review of Systems  All other systems reviewed and are negative.      Objective:   Physical Exam  Constitutional: He is oriented to person, place, and time. He appears well-developed and well-nourished. No distress.  HENT:  Head: Normocephalic and atraumatic.  Right Ear: External ear normal.  Left Ear: External ear normal.  Nose: Nose normal.  Mouth/Throat: Oropharynx is clear and moist. No oropharyngeal exudate.  Eyes: Conjunctivae and EOM are normal. Pupils are equal, round, and reactive to light. Right eye exhibits no discharge. Left eye exhibits no discharge. No scleral icterus.  Neck: Normal range of motion. Neck supple. No JVD present. No tracheal deviation present. No thyromegaly present.  Cardiovascular: Normal rate, regular rhythm, normal heart sounds and intact distal pulses.   Exam reveals no gallop and no friction rub.   No murmur heard. Pulmonary/Chest: Effort normal and breath sounds normal. No stridor. No respiratory distress. He has no wheezes. He has no rales. He exhibits no tenderness.  Abdominal: Soft. Bowel sounds are normal. He exhibits no distension and no mass. There is no tenderness. There is no rebound and no guarding.  Musculoskeletal: Normal range of motion. He exhibits no edema or tenderness.  Lymphadenopathy:    He has no cervical adenopathy.  Neurological: He is alert and oriented to person, place, and  time. He has normal reflexes. He displays normal reflexes. No cranial nerve deficit. He exhibits normal muscle tone. Coordination normal.  Skin: Skin is warm. No rash noted. He is not diaphoretic. No erythema. No pallor.  Psychiatric: He has a normal mood and affect. His behavior is normal. Judgment and thought content normal.  Vitals reviewed. See description in history of present illness Assessment & Plan:   Laceration of right hand without foreign body, subsequent encounter Discontinue splint.  Begin range of motion stretching to regain range of motion.  Recommended soaking in warm salt water daily and then performing gentle stretching and flexion and extension exercises of the PIP and DIP joints of the 2nd and 3rd digits on the right hand.  Allow wound on the dorsum of the right second PIP joint to heal through secondary intention.

## 2024-07-11 ENCOUNTER — Ambulatory Visit

## 2024-07-11 VITALS — Ht 75.0 in | Wt 140.0 lb

## 2024-07-11 DIAGNOSIS — Z0000 Encounter for general adult medical examination without abnormal findings: Secondary | ICD-10-CM

## 2024-07-11 NOTE — Patient Instructions (Signed)
 Bradley Beasley , Thank you for taking time out of your busy schedule to complete your Annual Wellness Visit with me. I enjoyed our conversation and look forward to speaking with you again next year. I, as well as your care team,  appreciate your ongoing commitment to your health goals. Please review the following plan we discussed and let me know if I can assist you in the future. Your Game plan/ To Do List      Follow up Visits: We will see or speak with you next year for your Next Medicare AWV with our clinical staff Have you seen your provider in the last 6 months (3 months if uncontrolled diabetes)? Yes  Clinician Recommendations:  Aim for 30 minutes of exercise or brisk walking, 6-8 glasses of water, and 5 servings of fruits and vegetables each day.       This is a list of the screenings recommended for you:  Health Maintenance  Topic Date Due   COVID-19 Vaccine (5 - 2024-25 season) 08/07/2023   Flu Shot  07/06/2024   Medicare Annual Wellness Visit  07/11/2025   Pneumococcal Vaccine for age over 51  Completed   Zoster (Shingles) Vaccine  Completed   Hepatitis B Vaccine  Aged Out   HPV Vaccine  Aged Out   Meningitis B Vaccine  Aged Out   DTaP/Tdap/Td vaccine  Discontinued    Advanced directives: (ACP Link)Information on Advanced Care Planning can be found at [place]  Secretary of [facility] Advance Health Care Directives Advance Health Care Directives. [email or web address]   Advance Care Planning is important because it:  [x]  Makes sure you receive the medical care that is consistent with your values, goals, and preferences  [x]  It provides guidance to your family and loved ones and reduces their decisional burden about whether or not they are making the right decisions based on your wishes.  Follow the link provided in your after visit summary or read over the paperwork we have mailed to you to help you started getting your Advance Directives in place. If you need assistance in completing  these, please reach out to us  so that we can help you!  See attachments for Preventive Care and Fall Prevention Tips.

## 2024-07-11 NOTE — Progress Notes (Signed)
 Subjective:   Bradley Beasley is a 89 y.o. who presents for a Medicare Wellness preventive visit.  As a reminder, Annual Wellness Visits don't include a physical exam, and some assessments may be limited, especially if this visit is performed virtually. We may recommend an in-person follow-up visit with your provider if needed.  Visit Complete: Virtual I connected with  Bradley Beasley on 07/11/24 by a audio enabled telemedicine application and verified that I am speaking with the correct person using two identifiers.  Patient Location: Home  Provider Location: Home Office  I discussed the limitations of evaluation and management by telemedicine. The patient expressed understanding and agreed to proceed.  Vital Signs: Because this visit was a virtual/telehealth visit, some criteria may be missing or patient reported. Any vitals not documented were not able to be obtained and vitals that have been documented are patient reported.  VideoDeclined- This patient declined Librarian, academic. Therefore the visit was completed with audio only.  Persons Participating in Visit: Patient.  AWV Questionnaire: No: Patient Medicare AWV questionnaire was not completed prior to this visit.  Cardiac Risk Factors include: advanced age (>22men, >75 women);male gender     Objective:    Today's Vitals   07/11/24 1436  Weight: 140 lb (63.5 kg)  Height: 6' 3 (1.905 m)   Body mass index is 17.5 kg/m.     07/11/2024    2:43 PM 09/29/2020    8:16 AM  Advanced Directives  Does Patient Have a Medical Advance Directive? No Yes  Type of Advance Directive  Healthcare Power of Attorney  Would patient like information on creating a medical advance directive? Yes (MAU/Ambulatory/Procedural Areas - Information given)     Current Medications (verified) Outpatient Encounter Medications as of 07/11/2024  Medication Sig   Cyanocobalamin  (VITAMIN B 12 PO) Take by mouth.   meloxicam   (MOBIC ) 15 MG tablet Take 1 tablet (15 mg total) by mouth daily.   cephALEXin  (KEFLEX ) 500 MG capsule Take 1 capsule (500 mg total) by mouth 3 (three) times daily. (Patient not taking: Reported on 07/11/2024)   tamsulosin  (FLOMAX ) 0.4 MG CAPS capsule Take 1 capsule (0.4 mg total) by mouth daily. (Patient not taking: Reported on 07/11/2024)   No facility-administered encounter medications on file as of 07/11/2024.    Allergies (verified) Celebrex [celecoxib], Codeine, and Prevacid [lansoprazole]   History: Past Medical History:  Diagnosis Date   COPD (chronic obstructive pulmonary disease) (HCC)    GERD (gastroesophageal reflux disease)    w/ hiatal hernia   H/O sarcoidosis    History of BPH    History of Helicobacter pylori infection    MVP (mitral valve prolapse)    MV regerg   Personal history of other malignant neoplasm of skin    Tubular adenoma of colon    Past Surgical History:  Procedure Laterality Date   APPENDECTOMY     HERNIA REPAIR     TONSILLECTOMY     Family History  Problem Relation Age of Onset   Cancer Mother        ovarian   Cancer Father        pancreatic   Social History   Socioeconomic History   Marital status: Married    Spouse name: Not on file   Number of children: Not on file   Years of education: Not on file   Highest education level: Not on file  Occupational History   Not on file  Tobacco Use  Smoking status: Never   Smokeless tobacco: Never  Vaping Use   Vaping status: Never Used  Substance and Sexual Activity   Alcohol use: No   Drug use: No   Sexual activity: Not Currently  Other Topics Concern   Not on file  Social History Narrative   Not on file   Social Drivers of Health   Financial Resource Strain: Low Risk  (07/11/2024)   Overall Financial Resource Strain (CARDIA)    Difficulty of Paying Living Expenses: Not hard at all  Food Insecurity: No Food Insecurity (07/11/2024)   Hunger Vital Sign    Worried About Running Out of  Food in the Last Year: Never true    Ran Out of Food in the Last Year: Never true  Transportation Needs: No Transportation Needs (07/11/2024)   PRAPARE - Administrator, Civil Service (Medical): No    Lack of Transportation (Non-Medical): No  Physical Activity: Sufficiently Active (07/11/2024)   Exercise Vital Sign    Days of Exercise per Week: 6 days    Minutes of Exercise per Session: 60 min  Stress: No Stress Concern Present (07/11/2024)   [employer] of Occupational Health - Occupational Stress Questionnaire    Feeling of Stress: Not at all  Social Connections: Moderately Integrated (07/11/2024)   Social Connection and Isolation Panel    Frequency of Communication with Friends and Family: More than three times a week    Frequency of Social Gatherings with Friends and Family: Three times a week    Attends Religious Services: 1 to 4 times per year    Active Member of Clubs or Organizations: No    Attends Banker Meetings: Never    Marital Status: Married    Tobacco Counseling Counseling given: Not Answered    Clinical Intake:  Pre-visit preparation completed: Yes  Pain : No/denies pain  Diabetes: No  How often do you need to have someone help you when you read instructions, pamphlets, or other written materials from your doctor or pharmacy?: 1 - Never  Interpreter Needed?: No  Information entered by :: Charmaine Bloodgood LPN   Activities of Daily Living     07/11/2024    2:42 PM  In your present state of health, do you have any difficulty performing the following activities:  Hearing? 1  Vision? 0  Difficulty concentrating or making decisions? 0  Walking or climbing stairs? 0  Dressing or bathing? 0  Doing errands, shopping? 0  Preparing Food and eating ? N  Using the Toilet? N  In the past six months, have you accidently leaked urine? N  Do you have problems with loss of bowel control? N  Managing your Medications? N  Managing your  Finances? N  Housekeeping or managing your Housekeeping? N    Patient Care Team: Duanne Butler DASEN, MD as PCP - General (Family Medicine) Leslee Reusing, MD as Consulting Physician (Ophthalmology) Joshua Sieving, MD as Consulting Physician (Dermatology)  I have updated your Care Teams any recent Medical Services you may have received from other providers in the past year.     Assessment:   This is a routine wellness examination for Xaivier.  Hearing/Vision screen Hearing Screening - Comments:: Some hearing loss  Vision Screening - Comments:: Wears rx glasses - up to date with routine eye exams with Dr. Leslee    Goals Addressed             This Visit's Progress    Remain active  and independent   On track      Depression Screen     07/11/2024    2:45 PM 05/22/2024    3:45 PM 11/15/2023    9:41 AM 06/21/2023   10:00 AM 05/18/2022   10:28 AM 11/10/2021    9:21 AM 09/29/2020    8:17 AM  PHQ 2/9 Scores  PHQ - 2 Score 0 0 0 0 0 0 0  PHQ- 9 Score 3 4 0   1     Fall Risk     07/11/2024    2:44 PM 05/22/2024    3:44 PM 11/15/2023    9:40 AM 06/21/2023   10:00 AM 05/18/2022   10:28 AM  Fall Risk   Falls in the past year? 0 0 0 0 0  Number falls in past yr: 0 0 0 0   Injury with Fall? 0 0 0 0   Risk for fall due to : No Fall Risks No Fall Risks  No Fall Risks   Follow up Falls prevention discussed;Education provided;Falls evaluation completed Falls evaluation completed  Falls prevention discussed Falls evaluation completed      Data saved with a previous flowsheet row definition    MEDICARE RISK AT HOME:  Medicare Risk at Home Any stairs in or around the home?: No If so, are there any without handrails?: No Home free of loose throw rugs in walkways, pet beds, electrical cords, etc?: Yes Adequate lighting in your home to reduce risk of falls?: Yes Life alert?: No Use of a cane, walker or w/c?: No Grab bars in the bathroom?: Yes Shower chair or bench in shower?:  No Elevated toilet seat or a handicapped toilet?: No  TIMED UP AND GO:  Was the test performed?  No  Cognitive Function: 6CIT completed        07/11/2024    2:44 PM 09/29/2020    8:17 AM  6CIT Screen  What Year? 0 points 0 points  What month? 0 points 0 points  What time? 0 points 0 points  Count back from 20 0 points 0 points  Months in reverse 0 points 0 points  Repeat phrase 2 points 0 points  Total Score 2 points 0 points    Immunizations Immunization History  Administered Date(s) Administered   Fluad Quad(high Dose 65+) 08/30/2019, 09/17/2021, 10/04/2022   Fluad Trivalent(High Dose 65+) 09/09/2023   Influenza, High Dose Seasonal PF 09/13/2018   Influenza,inj,Quad PF,6+ Mos 09/05/2013, 09/03/2014, 09/07/2016, 09/02/2017, 08/22/2020   PFIZER(Purple Top)SARS-COV-2 Vaccination 09/22/2020, 04/17/2021   Pfizer(Comirnaty)Fall Seasonal Vaccine 12 years and older 10/19/2022, 05/05/2023   Pneumococcal Conjugate-13 11/12/2013   Pneumococcal Polysaccharide-23 01/10/2006   Respiratory Syncytial Virus Vaccine,Recomb Aduvanted(Arexvy) 10/19/2022   Tdap 08/11/2011, 05/31/2024   Zoster Recombinant(Shingrix ) 12/22/2017, 03/23/2018   Zoster, Live 11/23/2007    Screening Tests Health Maintenance  Topic Date Due   COVID-19 Vaccine (5 - 2024-25 season) 08/07/2023   INFLUENZA VACCINE  07/06/2024   Medicare Annual Wellness (AWV)  07/11/2025   Pneumococcal Vaccine: 50+ Years  Completed   Zoster Vaccines- Shingrix   Completed   Hepatitis B Vaccines  Aged Out   HPV VACCINES  Aged Out   Meningococcal B Vaccine  Aged Out   DTaP/Tdap/Td  Discontinued    Health Maintenance  Health Maintenance Due  Topic Date Due   COVID-19 Vaccine (5 - 2024-25 season) 08/07/2023   INFLUENZA VACCINE  07/06/2024    Additional Screening:  Vision Screening: Recommended annual ophthalmology exams for early detection of  glaucoma and other disorders of the eye. Would you like a referral to an eye  doctor? No    Dental Screening: Recommended annual dental exams for proper oral hygiene  Community Resource Referral / Chronic Care Management: CRR required this visit?  No   CCM required this visit?  No   Plan:    I have personally reviewed and noted the following in the patient's chart:   Medical and social history Use of alcohol, tobacco or illicit drugs  Current medications and supplements including opioid prescriptions. Patient is not currently taking opioid prescriptions. Functional ability and status Nutritional status Physical activity Advanced directives List of other physicians Hospitalizations, surgeries, and ER visits in previous 12 months Vitals Screenings to include cognitive, depression, and falls Referrals and appointments  In addition, I have reviewed and discussed with patient certain preventive protocols, quality metrics, and best practice recommendations. A written personalized care plan for preventive services as well as general preventive health recommendations were provided to patient.   Lavelle Pfeiffer [place], [place]   12/08/7972   After Visit Summary: (Pick Up) Due to this being a telephonic visit, with patients personalized plan was offered to patient and patient has requested to Pick up at office.  Notes: Nothing significant to report at this time.

## 2024-08-18 ENCOUNTER — Other Ambulatory Visit: Payer: Self-pay | Admitting: Family Medicine

## 2024-08-20 NOTE — Telephone Encounter (Signed)
 Rx 05/22/24 #30 3RF- too soon Requested Prescriptions  Pending Prescriptions Disp Refills   tamsulosin  (FLOMAX ) 0.4 MG CAPS capsule [Pharmacy Med Name: TAMSULOSIN  HCL 0.4 MG CAPSULE] 90 capsule 1    Sig: TAKE 1 CAPSULE BY MOUTH EVERY DAY     Urology: Alpha-Adrenergic Blocker Passed - 08/20/2024  1:33 PM      Passed - PSA in normal range and within 360 days    PSA  Date Value Ref Range Status  05/22/2024 0.88 < OR = 4.00 ng/mL Final    Comment:    The total PSA value from this assay system is  standardized against the WHO standard. The test  result will be approximately 20% lower when compared  to the equimolar-standardized total PSA (Beckman  Coulter). Comparison of serial PSA results should be  interpreted with this fact in mind. . This test was performed using the Siemens  chemiluminescent method. Values obtained from  different assay methods cannot be used interchangeably. PSA levels, regardless of value, should not be interpreted as absolute evidence of the presence or absence of disease.          Passed - Last BP in normal range    BP Readings from Last 1 Encounters:  06/15/24 110/62         Passed - Valid encounter within last 12 months    Recent Outpatient Visits           2 months ago Laceration of right hand without foreign body, subsequent encounter   [place] [facility] Duanne Butler DASEN, MD   2 months ago Laceration of right hand without foreign body, subsequent encounter   [place] [facility] Kayla Jeoffrey RAMAN, FNP   2 months ago Laceration of right hand without foreign body, initial encounter   [place] [facility] Duanne Butler DASEN, MD   3 months ago Kidney problem   [place] [facility] Duanne Butler DASEN, MD   5 months ago Chronic pain of left knee   [place] [facility] [place], Butler DASEN, MD

## 2024-08-23 DIAGNOSIS — L821 Other seborrheic keratosis: Secondary | ICD-10-CM | POA: Diagnosis not present

## 2024-08-23 DIAGNOSIS — I788 Other diseases of capillaries: Secondary | ICD-10-CM | POA: Diagnosis not present

## 2024-08-23 DIAGNOSIS — C4359 Malignant melanoma of other part of trunk: Secondary | ICD-10-CM | POA: Diagnosis not present

## 2024-08-23 DIAGNOSIS — L570 Actinic keratosis: Secondary | ICD-10-CM | POA: Diagnosis not present

## 2024-08-23 DIAGNOSIS — D485 Neoplasm of uncertain behavior of skin: Secondary | ICD-10-CM | POA: Diagnosis not present

## 2024-08-23 DIAGNOSIS — Z85828 Personal history of other malignant neoplasm of skin: Secondary | ICD-10-CM | POA: Diagnosis not present

## 2024-09-13 ENCOUNTER — Ambulatory Visit (INDEPENDENT_AMBULATORY_CARE_PROVIDER_SITE_OTHER)

## 2024-09-13 DIAGNOSIS — Z23 Encounter for immunization: Secondary | ICD-10-CM | POA: Diagnosis not present

## 2024-09-13 NOTE — Progress Notes (Signed)
 Patient is in office today for a nurse visit for HD flu Immunization. Patient Injection was given in the  Left deltoid. Patient tolerated injection well.  Elida GORMAN [place], CMA

## 2024-09-21 DIAGNOSIS — Z85820 Personal history of malignant melanoma of skin: Secondary | ICD-10-CM | POA: Diagnosis not present

## 2024-09-21 DIAGNOSIS — C4359 Malignant melanoma of other part of trunk: Secondary | ICD-10-CM | POA: Diagnosis not present

## 2024-09-21 DIAGNOSIS — Z85828 Personal history of other malignant neoplasm of skin: Secondary | ICD-10-CM | POA: Diagnosis not present

## 2024-10-26 DIAGNOSIS — Z961 Presence of intraocular lens: Secondary | ICD-10-CM | POA: Diagnosis not present

## 2024-10-26 DIAGNOSIS — H52203 Unspecified astigmatism, bilateral: Secondary | ICD-10-CM | POA: Diagnosis not present

## 2024-10-26 DIAGNOSIS — H26493 Other secondary cataract, bilateral: Secondary | ICD-10-CM | POA: Diagnosis not present

## 2024-11-15 ENCOUNTER — Encounter: Payer: Self-pay | Admitting: Family Medicine

## 2024-11-15 ENCOUNTER — Ambulatory Visit: Payer: Medicare PPO | Admitting: Family Medicine

## 2024-11-15 VITALS — BP 130/72 | HR 72 | Temp 97.5°F | Ht 75.0 in | Wt 145.0 lb

## 2024-11-15 DIAGNOSIS — Z0000 Encounter for general adult medical examination without abnormal findings: Secondary | ICD-10-CM

## 2024-11-15 DIAGNOSIS — Z0001 Encounter for general adult medical examination with abnormal findings: Secondary | ICD-10-CM | POA: Diagnosis not present

## 2024-11-15 DIAGNOSIS — I441 Atrioventricular block, second degree: Secondary | ICD-10-CM | POA: Diagnosis not present

## 2024-11-15 NOTE — Progress Notes (Signed)
 Subjective:    Patient ID: Bradley Beasley, male    DOB: 1933/04/25, 89 y.o.   MRN: [id number]  HPI Patient is a very pleasant 89 year old Caucasian gentleman who presents today for complete physical exam.  He has a remote history of sarcoidosis but has been in remission for decades.  He is asymptomatic today.  He denies any syncope or near syncope or chest pain or lightheadedness.  Last year he was found to be in Mobitz type I second-degree block.  Today he is in normal sinus rhythm.  He states that he is doing well with no concerns.  He is not due for any cancer screening because of his age.  His immunizations are up-to-date. Immunization History  Administered Date(s) Administered   Fluad Quad(high Dose 65+) 08/30/2019, 09/17/2021, 10/04/2022   Fluad Trivalent(High Dose 65+) 09/09/2023   INFLUENZA, HIGH DOSE SEASONAL PF 09/13/2018, 09/13/2024   Influenza,inj,Quad PF,6+ Mos 09/05/2013, 09/03/2014, 09/07/2016, 09/02/2017, 08/22/2020   PFIZER(Purple Top)SARS-COV-2 Vaccination 09/22/2020, 04/17/2021   Pfizer(Comirnaty)Fall Seasonal Vaccine 12 years and older 10/19/2022, 09/17/2024   Pneumococcal Conjugate-13 11/12/2013   Pneumococcal Polysaccharide-23 01/10/2006   Respiratory Syncytial Virus Vaccine,Recomb Aduvanted(Arexvy) 10/19/2022   Tdap 08/11/2011, 05/31/2024   Zoster Recombinant(Shingrix ) 12/22/2017, 03/23/2018   Zoster, Live 11/23/2007   He does report an occasional tickle in his throat near the level of the cricoid cartilage.  This typically occurs while he is eating and causes him to cough and sneeze once or twice.  It occurs sporadically and is not consistent.  He denies any dysphagia or food sticking or hemoptysis or fever or symptoms of pneumonia.  He also denies heartburn Past Medical History:  Diagnosis Date   COPD (chronic obstructive pulmonary disease) (HCC)    GERD (gastroesophageal reflux disease)    w/ hiatal hernia   H/O sarcoidosis    History of BPH    History of  Helicobacter pylori infection    MVP (mitral valve prolapse)    MV regerg   Personal history of other malignant neoplasm of skin    Tubular adenoma of colon    Past Surgical History:  Procedure Laterality Date   APPENDECTOMY     HERNIA REPAIR     TONSILLECTOMY     Current Outpatient Medications on File Prior to Visit  Medication Sig Dispense Refill   Cyanocobalamin  (VITAMIN B 12 PO) Take by mouth.     meloxicam  (MOBIC ) 15 MG tablet Take 1 tablet (15 mg total) by mouth daily. 30 tablet 3   No current facility-administered medications on file prior to visit.   Allergies  Allergen Reactions   Celebrex [Celecoxib] Nausea And Vomiting   Codeine    Prevacid [Lansoprazole]    Social History   Socioeconomic History   Marital status: Married    Spouse name: Not on file   Number of children: Not on file   Years of education: Not on file   Highest education level: Not on file  Occupational History   Not on file  Tobacco Use   Smoking status: Never   Smokeless tobacco: Never  Vaping Use   Vaping status: Never Used  Substance and Sexual Activity   Alcohol use: No   Drug use: No   Sexual activity: Not Currently  Other Topics Concern   Not on file  Social History Narrative   Not on file   Social Drivers of Health   Tobacco Use: Low Risk (11/15/2024)   Patient History    Smoking Tobacco Use: Never  Smokeless Tobacco Use: Never    Passive Exposure: Not on file  Financial Resource Strain: Low Risk (07/11/2024)   Overall Financial Resource Strain (CARDIA)    Difficulty of Paying Living Expenses: Not hard at all  Food Insecurity: No Food Insecurity (07/11/2024)   Epic    Worried About Programme Researcher, Broadcasting/film/video in the Last Year: Never true    Ran Out of Food in the Last Year: Never true  Transportation Needs: No Transportation Needs (07/11/2024)   Epic    Lack of Transportation (Medical): No    Lack of Transportation (Non-Medical): No  Physical Activity: Sufficiently Active  (07/11/2024)   Exercise Vital Sign    Days of Exercise per Week: 6 days    Minutes of Exercise per Session: 60 min  Stress: No Stress Concern Present (07/11/2024)   [employer] of Occupational Health - Occupational Stress Questionnaire    Feeling of Stress: Not at all  Social Connections: Moderately Integrated (07/11/2024)   Social Connection and Isolation Panel    Frequency of Communication with Friends and Family: More than three times a week    Frequency of Social Gatherings with Friends and Family: Three times a week    Attends Religious Services: 1 to 4 times per year    Active Member of Clubs or Organizations: No    Attends Banker Meetings: Never    Marital Status: Married  Catering Manager Violence: Not At Risk (07/11/2024)   Epic    Fear of Current or Ex-Partner: No    Emotionally Abused: No    Physically Abused: No    Sexually Abused: No  Depression (PHQ2-9): Low Risk (11/15/2024)   Depression (PHQ2-9)    PHQ-2 Score: 0  Alcohol Screen: Low Risk (07/11/2024)   Alcohol Screen    Last Alcohol Screening Score (AUDIT): 0  Housing: Unknown (07/11/2024)   Epic    Unable to Pay for Housing in the Last Year: No    Number of Times Moved in the Last Year: Not on file    Homeless in the Last Year: No  Utilities: Not At Risk (07/11/2024)   Epic    Threatened with loss of utilities: No  Health Literacy: Adequate Health Literacy (07/11/2024)   B1300 Health Literacy    Frequency of need for help with medical instructions: Never   Family History  Problem Relation Age of Onset   Cancer Mother        ovarian   Cancer Father        pancreatic       Review of Systems  All other systems reviewed and are negative.      Objective:   Physical Exam  Constitutional: He is oriented to person, place, and time. He appears well-developed and well-nourished. No distress.  HENT:  Head: Normocephalic and atraumatic.  Right Ear: External ear normal.  Left Ear: External ear  normal.  Nose: Nose normal.  Mouth/Throat: Oropharynx is clear and moist. No oropharyngeal exudate.  Eyes: Conjunctivae and EOM are normal. Pupils are equal, round, and reactive to light. Right eye exhibits no discharge. Left eye exhibits no discharge. No scleral icterus.  Neck: Normal range of motion. Neck supple. No JVD present. No tracheal deviation present. No thyromegaly present.  Cardiovascular: Normal rate, regular rhythm, normal heart sounds and intact distal pulses.  Exam reveals no gallop and no friction rub.   No murmur heard. Pulmonary/Chest: Effort normal and breath sounds normal. No stridor. No respiratory distress. He has no  wheezes. He has no rales. He exhibits no tenderness.  Abdominal: Soft. Bowel sounds are normal. He exhibits no distension and no mass. There is no tenderness. There is no rebound and no guarding.  Musculoskeletal: Normal range of motion. He exhibits no edema or tenderness.  Lymphadenopathy:    He has no cervical adenopathy.  Neurological: He is alert and oriented to person, place, and time. He has normal reflexes. He displays normal reflexes. No cranial nerve deficit. He exhibits normal muscle tone. Coordination normal.  Skin: Skin is warm. No rash noted. He is not diaphoretic. No erythema. No pallor.  Psychiatric: He has a normal mood and affect. His behavior is normal. Judgment and thought content normal.  Vitals reviewed.         Assessment & Plan:   Mobitz type 1 second degree AV block - Plan: CBC with Differential/Platelet, Comprehensive metabolic panel with GFR, TSH  Encounter for Medicare annual wellness exam Today the patient is in normal sinus rhythm.  He denies any falls or depression or memory loss.  Physical exam today is normal.  I question if the patient may be aspirating occasionally causing him to cough versus acid reflux causing cough by irritating the recurrent laryngeal nerve.  We discussed laryngoscopy but he does not feel that is  necessary at this time.  If symptoms worsen I would recommend ENT consultation for laryngoscopy.  If the patient starts coughing more frequently I would recommend a swallow study to rule out aspiration

## 2024-11-16 ENCOUNTER — Ambulatory Visit: Payer: Self-pay | Admitting: Family Medicine

## 2024-11-16 ENCOUNTER — Encounter: Payer: Medicare PPO | Admitting: Family Medicine

## 2024-11-16 LAB — COMPREHENSIVE METABOLIC PANEL WITH GFR
AG Ratio: 1.9 (calc) (ref 1.0–2.5)
ALT: 15 U/L (ref 9–46)
AST: 23 U/L (ref 10–35)
Albumin: 4.6 g/dL (ref 3.6–5.1)
Alkaline phosphatase (APISO): 65 U/L (ref 35–144)
BUN: 22 mg/dL (ref 7–25)
CO2: 32 mmol/L (ref 20–32)
Calcium: 10.1 mg/dL (ref 8.6–10.3)
Chloride: 101 mmol/L (ref 98–110)
Creat: 1.08 mg/dL (ref 0.70–1.22)
Globulin: 2.4 g/dL (ref 1.9–3.7)
Glucose, Bld: 98 mg/dL (ref 65–99)
Potassium: 4.6 mmol/L (ref 3.5–5.3)
Sodium: 141 mmol/L (ref 135–146)
Total Bilirubin: 0.9 mg/dL (ref 0.2–1.2)
Total Protein: 7 g/dL (ref 6.1–8.1)
eGFR: 66 mL/min/1.73m2 (ref >=60)

## 2024-11-16 LAB — CBC WITH DIFFERENTIAL/PLATELET
Absolute Lymphocytes: 1175 {cells}/uL (ref 850–3900)
Absolute Monocytes: 608 {cells}/uL (ref 200–950)
Basophils Absolute: 31 {cells}/uL (ref 0–200)
Basophils Relative: 0.6 %
Eosinophils Absolute: 130 {cells}/uL (ref 15–500)
Eosinophils Relative: 2.5 %
HCT: 46 % (ref 39.4–51.1)
Hemoglobin: 15.3 g/dL (ref 13.2–17.1)
MCH: 31.5 pg (ref 27.0–33.0)
MCHC: 33.3 g/dL (ref 31.6–35.4)
MCV: 94.7 fL (ref 81.4–101.7)
MPV: 11 fL (ref 7.5–12.5)
Monocytes Relative: 11.7 %
Neutro Abs: 3255 {cells}/uL (ref 1500–7800)
Neutrophils Relative %: 62.6 %
Platelets: 186 Thousand/uL (ref 140–400)
RBC: 4.86 Million/uL (ref 4.20–5.80)
RDW: 12.8 % (ref 11.0–15.0)
Total Lymphocyte: 22.6 %
WBC: 5.2 Thousand/uL (ref 3.8–10.8)

## 2024-11-16 LAB — TSH: TSH: 3.31 m[IU]/L (ref 0.40–4.50)

## 2025-07-17 ENCOUNTER — Ambulatory Visit
